# Patient Record
Sex: Male | Born: 1980 | Race: White | Hispanic: No | Marital: Married | Smoking: Current every day smoker
Health system: Southern US, Community
[De-identification: ages and names within clinical notes are randomized; demographics above are authoritative.]

## PROBLEM LIST (undated history)

## (undated) DIAGNOSIS — IMO0002 Reserved for concepts with insufficient information to code with codable children: Secondary | ICD-10-CM

## (undated) DIAGNOSIS — K029 Dental caries, unspecified: Secondary | ICD-10-CM

## (undated) DIAGNOSIS — N451 Epididymitis: Secondary | ICD-10-CM

## (undated) HISTORY — PX: VARICOCELE EXCISION: SUR582

## (undated) HISTORY — PX: KNEE ARTHROSCOPY: SUR90

---
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
## 2004-03-20 ENCOUNTER — Emergency Department (HOSPITAL_COMMUNITY): Admission: EM | Admit: 2004-03-20 | Discharge: 2004-03-20 | Payer: Self-pay | Admitting: Family Medicine

## 2004-06-04 ENCOUNTER — Emergency Department (HOSPITAL_COMMUNITY): Admission: EM | Admit: 2004-06-04 | Discharge: 2004-06-04 | Payer: Self-pay | Admitting: Family Medicine

## 2004-06-09 ENCOUNTER — Emergency Department (HOSPITAL_COMMUNITY): Admission: EM | Admit: 2004-06-09 | Discharge: 2004-06-09 | Payer: Self-pay | Admitting: Family Medicine

## 2006-01-22 ENCOUNTER — Emergency Department (HOSPITAL_COMMUNITY): Admission: EM | Admit: 2006-01-22 | Discharge: 2006-01-22 | Payer: Self-pay | Admitting: Family Medicine

## 2007-06-20 ENCOUNTER — Emergency Department: Payer: Self-pay

## 2007-07-17 ENCOUNTER — Emergency Department: Payer: Self-pay | Admitting: Emergency Medicine

## 2007-09-25 ENCOUNTER — Emergency Department: Payer: Self-pay | Admitting: Emergency Medicine

## 2007-09-25 ENCOUNTER — Other Ambulatory Visit: Payer: Self-pay

## 2008-08-26 ENCOUNTER — Emergency Department: Payer: Self-pay

## 2008-09-11 ENCOUNTER — Emergency Department: Payer: Self-pay | Admitting: Emergency Medicine

## 2008-10-06 ENCOUNTER — Emergency Department (HOSPITAL_COMMUNITY): Admission: EM | Admit: 2008-10-06 | Discharge: 2008-10-06 | Payer: Self-pay | Admitting: Family Medicine

## 2009-01-09 ENCOUNTER — Emergency Department (HOSPITAL_COMMUNITY): Admission: EM | Admit: 2009-01-09 | Discharge: 2009-01-09 | Payer: Self-pay | Admitting: Emergency Medicine

## 2009-06-05 ENCOUNTER — Emergency Department: Payer: Self-pay | Admitting: Emergency Medicine

## 2009-06-24 ENCOUNTER — Emergency Department: Payer: Self-pay | Admitting: Emergency Medicine

## 2009-07-04 ENCOUNTER — Emergency Department (HOSPITAL_COMMUNITY): Admission: EM | Admit: 2009-07-04 | Discharge: 2009-07-04 | Payer: Self-pay | Admitting: Family Medicine

## 2009-07-17 ENCOUNTER — Emergency Department: Payer: Self-pay | Admitting: Emergency Medicine

## 2009-07-23 ENCOUNTER — Emergency Department: Payer: Self-pay | Admitting: Emergency Medicine

## 2010-03-30 ENCOUNTER — Emergency Department: Payer: Self-pay | Admitting: Emergency Medicine

## 2011-02-20 LAB — HIV ANTIBODY (ROUTINE TESTING W REFLEX): HIV: NONREACTIVE

## 2011-03-02 LAB — GC/CHLAMYDIA PROBE AMP, GENITAL: Chlamydia, DNA Probe: NEGATIVE

## 2011-03-02 LAB — HIV ANTIBODY (ROUTINE TESTING W REFLEX): HIV: NONREACTIVE

## 2011-03-02 LAB — RPR: RPR Ser Ql: NONREACTIVE

## 2011-11-03 ENCOUNTER — Emergency Department: Payer: Self-pay | Admitting: Emergency Medicine

## 2011-12-17 ENCOUNTER — Emergency Department: Payer: Self-pay | Admitting: Unknown Physician Specialty

## 2011-12-17 LAB — CBC WITH DIFFERENTIAL/PLATELET
Basophil %: 0.3 %
Eosinophil %: 2.2 %
HCT: 45.6 % (ref 40.0–52.0)
HGB: 16 g/dL (ref 13.0–18.0)
Lymphocyte #: 1.6 10*3/uL (ref 1.0–3.6)
MCH: 33.3 pg (ref 26.0–34.0)
MCV: 95 fL (ref 80–100)
Monocyte #: 0.8 10*3/uL — ABNORMAL HIGH (ref 0.0–0.7)
Monocyte %: 5.6 %
Neutrophil #: 10.8 10*3/uL — ABNORMAL HIGH (ref 1.4–6.5)
RBC: 4.81 10*6/uL (ref 4.40–5.90)
WBC: 13.5 10*3/uL — ABNORMAL HIGH (ref 3.8–10.6)

## 2011-12-17 LAB — BASIC METABOLIC PANEL
BUN: 21 mg/dL — ABNORMAL HIGH (ref 7–18)
Chloride: 103 mmol/L (ref 98–107)
EGFR (Non-African Amer.): >60
Glucose: 94 mg/dL (ref 65–99)
Potassium: 4 mmol/L (ref 3.5–5.1)
Sodium: 142 mmol/L (ref 136–145)

## 2011-12-20 ENCOUNTER — Emergency Department: Payer: Self-pay | Admitting: Emergency Medicine

## 2011-12-29 ENCOUNTER — Emergency Department (HOSPITAL_COMMUNITY)
Admission: EM | Admit: 2011-12-29 | Discharge: 2011-12-29 | Disposition: A | Discharge status: Home / Self Care | Payer: Self-pay | Attending: Emergency Medicine | Admitting: Emergency Medicine

## 2011-12-29 ENCOUNTER — Emergency Department (HOSPITAL_COMMUNITY): Payer: Self-pay

## 2011-12-29 ENCOUNTER — Encounter (HOSPITAL_COMMUNITY): Payer: Self-pay | Admitting: *Deleted

## 2011-12-29 DIAGNOSIS — R1032 Left lower quadrant pain: Secondary | ICD-10-CM | POA: Insufficient documentation

## 2011-12-29 DIAGNOSIS — IMO0002 Reserved for concepts with insufficient information to code with codable children: Secondary | ICD-10-CM | POA: Insufficient documentation

## 2011-12-29 DIAGNOSIS — R10819 Abdominal tenderness, unspecified site: Secondary | ICD-10-CM | POA: Insufficient documentation

## 2011-12-29 DIAGNOSIS — R197 Diarrhea, unspecified: Secondary | ICD-10-CM | POA: Insufficient documentation

## 2011-12-29 DIAGNOSIS — R110 Nausea: Secondary | ICD-10-CM | POA: Insufficient documentation

## 2011-12-29 DIAGNOSIS — R141 Gas pain: Secondary | ICD-10-CM | POA: Insufficient documentation

## 2011-12-29 DIAGNOSIS — N451 Epididymitis: Secondary | ICD-10-CM | POA: Insufficient documentation

## 2011-12-29 DIAGNOSIS — F17200 Nicotine dependence, unspecified, uncomplicated: Secondary | ICD-10-CM | POA: Insufficient documentation

## 2011-12-29 DIAGNOSIS — R195 Other fecal abnormalities: Secondary | ICD-10-CM | POA: Insufficient documentation

## 2011-12-29 DIAGNOSIS — R142 Eructation: Secondary | ICD-10-CM | POA: Insufficient documentation

## 2011-12-29 HISTORY — DX: Epididymitis: N45.1

## 2011-12-29 HISTORY — DX: Reserved for concepts with insufficient information to code with codable children: IMO0002

## 2011-12-29 LAB — BASIC METABOLIC PANEL
CO2: 27 mEq/L (ref 19–32)
Calcium: 10.1 mg/dL (ref 8.4–10.5)
Creatinine, Ser: 1.25 mg/dL (ref 0.50–1.35)
Glucose, Bld: 108 mg/dL — ABNORMAL HIGH (ref 70–99)

## 2011-12-29 LAB — CBC
Hemoglobin: 16.7 g/dL (ref 13.0–17.0)
MCHC: 36.7 g/dL — ABNORMAL HIGH (ref 30.0–36.0)
Platelets: 225 10*3/uL (ref 150–400)

## 2011-12-29 LAB — OCCULT BLOOD, POC DEVICE: Fecal Occult Bld: POSITIVE

## 2011-12-29 LAB — DIFFERENTIAL
Eosinophils Relative: 0 % (ref 0–5)
Lymphs Abs: 1.3 10*3/uL (ref 0.7–4.0)
Monocytes Absolute: 0.6 10*3/uL (ref 0.1–1.0)
Neutro Abs: 14.5 10*3/uL — ABNORMAL HIGH (ref 1.7–7.7)
Neutrophils Relative %: 88 % — ABNORMAL HIGH (ref 43–77)

## 2011-12-29 MED ORDER — ONDANSETRON HCL 4 MG PO TABS: 4.0000 mg | ORAL_TABLET | Freq: Four times a day (QID) | ORAL | Status: AC

## 2011-12-29 MED ORDER — OXYCODONE-ACETAMINOPHEN 5-325 MG PO TABS: ORAL_TABLET | ORAL | Status: AC

## 2011-12-29 MED ORDER — CIPROFLOXACIN HCL 500 MG PO TABS: 500.0000 mg | ORAL_TABLET | Freq: Two times a day (BID) | ORAL | Status: DC

## 2011-12-29 MED ORDER — CIPROFLOXACIN HCL 500 MG PO TABS: 500.0000 mg | ORAL_TABLET | Freq: Two times a day (BID) | ORAL | Status: AC

## 2011-12-29 MED ORDER — HYDROMORPHONE HCL PF 1 MG/ML IJ SOLN
1.0000 mg | Freq: Once | INTRAMUSCULAR | Status: AC
  Administered 2011-12-29: 1 mg via INTRAVENOUS
  Filled 2011-12-29: qty 1

## 2011-12-29 MED ORDER — ONDANSETRON HCL 4 MG/2ML IJ SOLN: 4.0000 mg | Freq: Once | INTRAMUSCULAR | Status: DC

## 2011-12-29 MED ORDER — ONDANSETRON HCL 4 MG/2ML IJ SOLN
4.0000 mg | Freq: Once | INTRAMUSCULAR | Status: AC
  Administered 2011-12-29: 4 mg via INTRAVENOUS
  Filled 2011-12-29: qty 2

## 2011-12-29 MED ORDER — PANTOPRAZOLE SODIUM 40 MG IV SOLR
40.0000 mg | Freq: Once | INTRAVENOUS | Status: AC
  Administered 2011-12-29: 40 mg via INTRAVENOUS
  Filled 2011-12-29: qty 40

## 2011-12-29 MED ORDER — IOHEXOL 300 MG/ML  SOLN
80.0000 mL | Freq: Once | INTRAMUSCULAR | Status: AC | PRN
  Administered 2011-12-29: 80 mL via INTRAVENOUS

## 2011-12-29 MED ORDER — OXYCODONE-ACETAMINOPHEN 5-325 MG PO TABS
2.0000 | ORAL_TABLET | Freq: Once | ORAL | Status: AC
  Administered 2011-12-29: 2 via ORAL
  Filled 2011-12-29: qty 2

## 2011-12-29 MED ORDER — SODIUM CHLORIDE 0.9 % IV BOLUS (SEPSIS)
1000.0000 mL | Freq: Once | INTRAVENOUS | Status: AC
  Administered 2011-12-29: 1000 mL via INTRAVENOUS

## 2011-12-29 MED ORDER — HYDROMORPHONE HCL PF 1 MG/ML IJ SOLN: 1.0000 mg | Freq: Once | INTRAMUSCULAR | Status: DC

## 2011-12-29 NOTE — Discharge Instructions (Signed)
Take antibiotic for complete course. Use percocet as needed for pain and zofran as needed for nausea. You may consider over the counter imodium to help with diarrhea but DO NOT TAKE LOMOTIL. Stay well hydrated following a bland diet for the next 72 hours. Return to ER for changing or worsening of symptoms as discussed.   Bloody Diarrhea Bloody diarrhea can be caused by many different conditions. Most of the time bloody diarrhea is the result of food poisoning or minor infections. Bloody diarrhea usually improves over 2 to 3 days of rest and fluid replacement. Other conditions that can cause bloody diarrhea include:  Internal bleeding.   Infection.   Diseases of the bowel and colon.  Internal bleeding from an ulcer or bowel disease can be severe and requires hospital care or even surgery. DIAGNOSIS  To find out what is wrong your caregiver may check your:  Stool.   Blood.   Results from a test that looks inside the body (endoscopy).  TREATMENT   Get plenty of rest.   Drink enough water and fluids to keep your urine clear or pale yellow.   Do not smoke.   Solid foods and dairy products should be avoided until your illness improves.   As you improve, slowly return to a regular diet with easily-digested foods first. Examples are:   Bananas.   Rice.   Toast.   Crackers.  You should only need these for about 2 days before adding more normal foods to your diet.  Avoid spicy or fatty foods as well as caffeine and alcohol for several days.   Medicine to control cramping and diarrhea can relieve symptoms but may prolong some cases of bloody diarrhea. Antibiotics can speed recovery from diarrhea due to some bacterial infections. Call your caregiver if diarrhea does not get better in 3 days.  SEEK MEDICAL CARE IF:   You do not improve after 3 days.   Your diarrhea improves but your stool appears black.  SEEK IMMEDIATE MEDICAL CARE IF:   You become extremely weak or faint.    You become very sweaty.   You have increased pain or bleeding.   You develop repeated vomiting.   You vomit and you see blood or the vomit looks black in color.   You have a fever.  Document Released: 11/01/2005 Document Revised: 07/14/2011 Document Reviewed: 10/03/2009 [facility] Patient Information 2012 [place], [place].  Diet for Diarrhea, Adult Having frequent, runny stools (diarrhea) has many causes. Diarrhea may be caused or worsened by food or drink. Diarrhea may be relieved by changing your diet. IF YOU ARE NOT TOLERATING SOLID FOODS:  Drink enough water and fluids to keep your urine clear or pale yellow.   Avoid sugary drinks and sodas as well as milk-based beverages.   Avoid beverages containing caffeine and alcohol.   You may try rehydrating beverages. You can make your own by following this recipe:    tsp table salt.    tsp baking soda.   ? tsp salt substitute (potassium chloride).   1 tbs + 1 tsp sugar.   1 qt water.  As your stools become more solid, you can start eating solid foods. Add foods one at a time. If a certain food causes your diarrhea to get worse, avoid that food and try other foods. A low fiber, low-fat, and lactose-free diet is recommended. Small, frequent meals may be better tolerated.  Starches  Allowed:  White, [place], and pita breads, plain rolls, buns, bagels. Plain muffins,  matzo. Neita Carp, saltine, or graham crackers. Pretzels, melba toast, zwieback. Cooked cereals made with water: cornmeal, farina, cream cereals. Dry cereals: refined corn, wheat, rice. Potatoes prepared any way without skins, refined macaroni, spaghetti, noodles, refined rice.   Avoid:  Bread, rolls, or crackers made with whole wheat, multi-grains, rye, bran seeds, nuts, or coconut. Corn tortillas or taco shells. Cereals containing whole grains, multi-grains, bran, coconut, nuts, or raisins. Cooked or dry oatmeal. Coarse wheat cereals, granola. Cereals advertised as  "high-fiber." Potato skins. Whole grain pasta, wild or brown rice. Popcorn. Sweet potatoes/yams. Sweet rolls, doughnuts, waffles, pancakes, sweet breads.  Vegetables  Allowed: Strained tomato and vegetable juices. Most well-cooked and canned vegetables without seeds. Fresh: Tender lettuce, cucumber without the skin, cabbage, spinach, bean sprouts.   Avoid: Fresh, cooked, or canned: Artichokes, baked beans, beet greens, broccoli, Brussels sprouts, corn, kale, legumes, peas, sweet potatoes. Cooked: Green or red cabbage, spinach. Avoid large servings of any vegetables, because vegetables shrink when cooked, and they contain more fiber per serving than fresh vegetables.  Fruit  Allowed: All fruit juices except prune juice. Cooked or canned: Apricots, applesauce, cantaloupe, cherries, fruit cocktail, grapefruit, grapes, kiwi, mandarin oranges, peaches, pears, plums, watermelon. Fresh: Apples without skin, ripe banana, grapes, cantaloupe, cherries, grapefruit, peaches, oranges, plums. Keep servings limited to  cup or 1 piece.   Avoid: Fresh: Apple with skin, apricots, mango, pears, raspberries, strawberries. Prune juice, stewed or dried prunes. Dried fruits, raisins, dates. Large servings of all fresh fruits.  Meat and Meat Substitutes  Allowed: Ground or well-cooked tender beef, ham, veal, lamb, pork, or poultry. Eggs, plain cheese. Fish, oysters, shrimp, lobster, other seafoods. Liver, organ meats.   Avoid: Tough, fibrous meats with gristle. Peanut butter, smooth or chunky. Cheese, nuts, seeds, legumes, dried peas, beans, lentils.  Milk  Allowed: Yogurt, lactose-free milk, kefir, drinkable yogurt, buttermilk, soy milk.   Avoid: Milk, chocolate milk, beverages made with milk, such as milk shakes.  Soups  Allowed: Bouillon, broth, or soups made from allowed foods. Any strained soup.   Avoid: Soups made from vegetables that are not allowed, cream or milk-based soups.  Desserts and  Sweets  Allowed: Sugar-free gelatin, sugar-free frozen ice pops made without sugar alcohol.   Avoid: Plain cakes and cookies, pie made with allowed fruit, pudding, custard, cream pie. Gelatin, fruit, ice, sherbet, frozen ice pops. Ice cream, ice milk without nuts. Plain hard candy, honey, jelly, molasses, syrup, sugar, chocolate syrup, gumdrops, marshmallows.  Fats and Oils  Allowed: Avoid any fats and oils.   Avoid: Seeds, nuts, olives, avocados. Margarine, butter, cream, mayonnaise, salad oils, plain salad dressings made from allowed foods. Plain gravy, crisp bacon without rind.  Beverages  Allowed: Water, decaffeinated teas, oral rehydration solutions, sugar-free beverages.   Avoid: Fruit juices, caffeinated beverages (coffee, tea, soda or pop), alcohol, sports drinks, or lemon-lime soda or pop.  Condiments  Allowed: Ketchup, mustard, horseradish, vinegar, cream sauce, cheese sauce, cocoa powder. Spices in moderation: allspice, basil, bay leaves, celery powder or leaves, cinnamon, cumin powder, curry powder, ginger, mace, marjoram, onion or garlic powder, oregano, paprika, parsley flakes, ground pepper, rosemary, sage, savory, tarragon, thyme, turmeric.   Avoid: Coconut, honey.  Weight Monitoring: Weigh yourself every day. You should weigh yourself in the morning after you urinate and before you eat breakfast. Wear the same amount of clothing when you weigh yourself. Record your weight daily. Bring your recorded weights to your clinic visits. Tell your caregiver right away if you have gained 3 lb/1.4  kg or more in 1 day, 5 lb/2.3 kg in a week, or whatever amount you were told to report. SEEK IMMEDIATE MEDICAL CARE IF:   You are unable to keep fluids down.   You start to throw up (vomit) or diarrhea keeps coming back (persistent).   Abdominal pain develops, increases, or can be felt in one place (localizes).   You have an oral temperature above 102 F (38.9 C), not controlled by  medicine.   Diarrhea contains blood or mucus.   You develop excessive weakness, dizziness, fainting, or extreme thirst.  MAKE SURE YOU:   Understand these instructions.   Will watch your condition.   Will get help right away if you are not doing well or get worse.  Document Released: 01/22/2004 Document Revised: 07/14/2011 Document Reviewed: 05/15/2009 [facility] Patient Information 2012 [place], [place].

## 2011-12-29 NOTE — ED Provider Notes (Signed)
History     CSN: [id number]  Arrival date & time 12/29/11  1125   First MD Initiated Contact with Patient 12/29/11 1130      Chief Complaint  Patient presents with  . Rectal Bleeding    (Consider location/radiation/quality/duration/timing/severity/associated sxs/prior treatment) HPI  Patient presents to ER complaining of acute onset lower abdominal cramping and bloody diarrhea that woke him from his sleep at 1am and has been persistent and unchanging since onset. Associated nausea but denies vomiting. Denies fevers or chills. Patient states he recently completed a week course of Keflex for URI. Patient notes that approximately 2 years ago when living in [place] he was evaluated for black stool but does not remember specific details or outcome to that evaluation. Patient states he was dx with stomach ulcers as a child but denies known hx of bleeding ulcers or GI bleed. Denies sick contacts. Denies aggravating or alleviating factors. Patient takes no meds on regular basis.   Past Medical History  Diagnosis Date  . Ulcer   . Epididymitis     Past Surgical History  Procedure Date  . Knee arthroscopy     No family history on file.  History  Substance Use Topics  . Smoking status: Current Everyday Smoker -- 0.5 packs/day for 10 years    Types: Cigarettes  . Smokeless tobacco: Not on file  . Alcohol Use: No      Review of Systems  All other systems reviewed and are negative.    Allergies  Morphine and related and Vancomycin  Home Medications   Current Outpatient Rx  Name Route Sig Dispense Refill  . CEPHALEXIN 500 MG PO CAPS Oral Take 500 mg by mouth 4 (four) times daily. Take for 7 days. First doses 12/18/11      BP 118/74  Pulse 67  Temp(Src) 97.6 F (36.4 C) (Oral)  Resp 16  Ht 5\' 6"  (1.676 m)  Wt 180 lb (81.647 kg)  BMI 29.05 kg/m2  SpO2 94%  Physical Exam  Nursing note and vitals reviewed. Constitutional: He is oriented to person, place, and time. He  appears well-developed and well-nourished. No distress.  HENT:  Head: Normocephalic and atraumatic.  Eyes: Conjunctivae are normal.  Neck: Normal range of motion. Neck supple.  Cardiovascular: Normal rate, regular rhythm, normal heart sounds and intact distal pulses.  Exam reveals no gallop and no friction rub.   No murmur heard. Pulmonary/Chest: Effort normal and breath sounds normal. No respiratory distress. He has no wheezes. He has no rales. He exhibits no tenderness.  Abdominal: Soft. Bowel sounds are normal. He exhibits no distension and no mass. There is tenderness. There is no rebound and no guarding.       Diffuse TTP of lower abdomen with guarding but no rigidity.   Genitourinary: Guaiac positive stool.  Musculoskeletal: Normal range of motion. He exhibits no edema and no tenderness.  Neurological: He is alert and oriented to person, place, and time.  Skin: Skin is warm and dry. No rash noted. He is not diaphoretic. No erythema.  Psychiatric: He has a normal mood and affect.    ED Course  Procedures (including critical care time)  IV fluids, IV dilaudid and zofran  Patient instructed to collect stool. Blood diarrhea visualized in toilet from previous BM with bright red blood.  Patient states the lower abdominal cramping is improving.   Labs Reviewed  CBC - Abnormal; Notable for the following:    WBC 16.4 (*)    MCHC 36.7 (*)  All other components within normal limits  DIFFERENTIAL - Abnormal; Notable for the following:    Neutrophils Relative 88 (*)    Neutro Abs 14.5 (*)    Lymphocytes Relative 8 (*)    All other components within normal limits  BASIC METABOLIC PANEL - Abnormal; Notable for the following:    Glucose, Bld 108 (*)    GFR calc non Af Amer 77 (*)    GFR calc Af Amer 89 (*)    All other components within normal limits  CLOSTRIDIUM DIFFICILE BY PCR  OCCULT BLOOD, POC DEVICE  OCCULT BLOOD X 1 CARD TO LAB, STOOL  STOOL CULTURE   Ct Abdomen Pelvis W  Contrast  12/29/2011  *RADIOLOGY REPORT*  Clinical Data: The left lower quadrant pain, abdominal distention. Bloody stools.  CT ABDOMEN AND PELVIS WITH CONTRAST  Technique:  Multidetector CT imaging of the abdomen and pelvis was performed following the standard protocol during bolus administration of intravenous contrast.  Contrast: 80mL OMNIPAQUE IOHEXOL 300 MG/ML IV SOLN  Comparison: 04/14/2009  Findings: Lung bases are clear.  No effusions.  Heart is normal size.  Liver, gallbladder, spleen, pancreas, adrenals and kidneys are normal.  Colon is decompressed and grossly unremarkable.  Small bowel unremarkable.  No free fluid, free air or adenopathy.  Aorta is normal caliber.  Urinary bladder is unremarkable.  No acute bony abnormality.  IMPRESSION: No acute findings in the abdomen or pelvis.  Original Report Authenticated By: Cyndie Chime, M.D.     1. Bloody diarrhea       MDM  Bloody diarrhea without acute findings on CT scan and C diff negative. Question infectious diarrhea of unknown source. Stable hgb. Spoke at length with patient about OP treatment of infectious diarrhea, with abx, pain medication and nausea medication addressing at length worsening signs and symptoms that should warrant return to ER. Patient voices his understanding and is agreeable to plan.   Afebrile, non toxic appearing.         Jenness Corner, [place] 12/29/11 1523

## 2011-12-29 NOTE — ED Notes (Signed)
Drinking contrast for CT scan

## 2011-12-29 NOTE — ED Notes (Signed)
Ct notified pt finished drinking contrast.  

## 2011-12-29 NOTE — ED Notes (Signed)
Reports LLQ pain, n/v, bloody stools started 0100. Pain worse with BM's, very tender. C/o abd bloated. No fever.

## 2011-12-29 NOTE — ED Notes (Signed)
C/o bloody stools, abd cramping since 0100. Denies n/v

## 2011-12-29 NOTE — ED Notes (Signed)
Awaiting test results. No bloody BM's or active vomiting presently

## 2011-12-29 NOTE — ED Provider Notes (Signed)
Medical screening examination/treatment/procedure(s) were performed by non-physician practitioner and as supervising physician I was immediately available for consultation/collaboration.   Dequandre Cordova, MD 12/29/11 1550 

## 2011-12-29 NOTE — ED Notes (Signed)
Patient transported to CT 

## 2012-01-02 LAB — STOOL CULTURE

## 2013-01-15 ENCOUNTER — Emergency Department (HOSPITAL_COMMUNITY)
Admission: EM | Admit: 2013-01-15 | Discharge: 2013-01-15 | Disposition: A | Discharge status: Home / Self Care | Payer: Self-pay | Attending: Emergency Medicine | Admitting: Emergency Medicine

## 2013-01-15 ENCOUNTER — Encounter (HOSPITAL_COMMUNITY): Payer: Self-pay | Admitting: Emergency Medicine

## 2013-01-15 DIAGNOSIS — K029 Dental caries, unspecified: Secondary | ICD-10-CM | POA: Insufficient documentation

## 2013-01-15 DIAGNOSIS — Z8719 Personal history of other diseases of the digestive system: Secondary | ICD-10-CM | POA: Insufficient documentation

## 2013-01-15 DIAGNOSIS — K056 Periodontal disease, unspecified: Secondary | ICD-10-CM | POA: Insufficient documentation

## 2013-01-15 DIAGNOSIS — Z87448 Personal history of other diseases of urinary system: Secondary | ICD-10-CM | POA: Insufficient documentation

## 2013-01-15 DIAGNOSIS — F17200 Nicotine dependence, unspecified, uncomplicated: Secondary | ICD-10-CM | POA: Insufficient documentation

## 2013-01-15 MED ORDER — OXYCODONE-ACETAMINOPHEN 5-325 MG PO TABS: 1.0000 | ORAL_TABLET | ORAL | Status: DC | PRN

## 2013-01-15 MED ORDER — OXYCODONE-ACETAMINOPHEN 5-325 MG PO TABS
1.0000 | ORAL_TABLET | Freq: Once | ORAL | Status: AC
  Administered 2013-01-15: 1 via ORAL
  Filled 2013-01-15: qty 1

## 2013-01-15 MED ORDER — PENICILLIN V POTASSIUM 500 MG PO TABS
500.0000 mg | ORAL_TABLET | Freq: Once | ORAL | Status: AC
  Administered 2013-01-15: 500 mg via ORAL
  Filled 2013-01-15: qty 1

## 2013-01-15 MED ORDER — PENICILLIN V POTASSIUM 500 MG PO TABS: 500.0000 mg | ORAL_TABLET | Freq: Four times a day (QID) | ORAL | Status: DC

## 2013-01-15 NOTE — ED Provider Notes (Signed)
History    This chart was scribed for non-physician practitioner working with [name], MD by Charolett Bumpers, ED Scribe. This patient was seen in room WTR6/WTR6 and the patient's care was started at 1716.   CSN: [id number]  Arrival date & time 01/15/13  1606   First MD Initiated Contact with Patient 01/15/13 1716      Chief Complaint  Patient presents with  . Dental Pain    The history is provided by the patient. No language interpreter was used.   Jonathan Duke is a 32 y.o. male who presents to the Emergency Department complaining of persistent, moderate right upper wisdom tooth pain that has gradually worsened over the past week. He reports associated right sided facial swelling. He has been taking Ibuprofen and Tramadol without relief. He is a smoker.   Past Medical History  Diagnosis Date  . Ulcer   . Epididymitis     Past Surgical History  Procedure Laterality Date  . Knee arthroscopy      No family history on file.  History  Substance Use Topics  . Smoking status: Current Every Day Smoker -- 0.50 packs/day for 10 years    Types: Cigarettes  . Smokeless tobacco: Not on file  . Alcohol Use: No      Review of Systems A complete 10 system review of systems was obtained and all systems are negative except as noted in the HPI and PMH.   Allergies  Morphine and related and Vancomycin  Home Medications   Current Outpatient Rx  Name  Route  Sig  Dispense  Refill  . traMADol (ULTRAM) 50 MG tablet   Oral   Take 50 mg by mouth once.           BP 139/64  Pulse 96  Temp(Src) 98.7 F (37.1 C) (Oral)  Resp 17  SpO2 100%  Physical Exam  Nursing note and vitals reviewed. Constitutional: He is oriented to person, place, and time. He appears well-developed and well-nourished. No distress.  HENT:  Head: Normocephalic and atraumatic.  Mouth/Throat: Oropharynx is clear and moist.  On tooth #1, significant decay and moderate gingival swelling.  No signs of a fluctuant abscess.   Eyes: EOM are normal.  Neck: Neck supple. No tracheal deviation present.  Cardiovascular: Normal rate.   Pulmonary/Chest: Effort normal. No respiratory distress.  Musculoskeletal: Normal range of motion.  Neurological: He is alert and oriented to person, place, and time.  Skin: Skin is warm and dry.  Psychiatric: He has a normal mood and affect. His behavior is normal.    ED Course  Procedures (including critical care time)  DIAGNOSTIC STUDIES: Oxygen Saturation is 100% on room air, normal by my interpretation.    COORDINATION OF CARE:  17:35-Discussed planned course of treatment with the patient including a Percocet and Veetid here in ED, who is agreeable at this time. Advised to f/u with a dentist.   Labs Reviewed - No data to display No results found.   No diagnosis found.  1. Dental caries   MDM  Uncomplicated dental decay causing pain.   I personally performed the services described in this documentation, which was scribed in my presence. The recorded information has been reviewed and is accurate.        Arnoldo Hooker, PA-C 01/15/13 1747

## 2013-01-15 NOTE — Progress Notes (Signed)
[facility] ED CM noted not pcp for pt CM spoke with pt about list of [place] self pay pcps and referrals to dentists CM informed pt that dental referral can be provided but [facility] does not manage the cost of care by these dental providers and [place] does not have listed self pay dental providers.  Provided him with the list of available self pay pcps for [place], health reform/ affordable care act resources and medication resources for self pay patients Pt reports he has called a list of [place] [facility] therapist and was informed of cost of care/fee ($400) Reports having half of co pay and he will attempt to obtain the rest of the co pay

## 2013-01-15 NOTE — ED Provider Notes (Signed)
Medical screening examination/treatment/procedure(s) were performed by non-physician practitioner and as supervising physician I was immediately available for consultation/collaboration.   [name], MD 01/15/13 [phone]

## 2013-01-15 NOTE — ED Notes (Signed)
States that he has right upper wisdom tooth that is very painful. States that he has tried ibuprogen and tramadol and nothing is helping.

## 2013-02-10 ENCOUNTER — Encounter (HOSPITAL_COMMUNITY): Payer: Self-pay | Admitting: *Deleted

## 2013-02-10 ENCOUNTER — Emergency Department (HOSPITAL_COMMUNITY)
Admission: EM | Admit: 2013-02-10 | Discharge: 2013-02-11 | Disposition: A | Discharge status: Home / Self Care | Payer: Self-pay | Attending: Emergency Medicine | Admitting: Emergency Medicine

## 2013-02-10 DIAGNOSIS — K006 Disturbances in tooth eruption: Secondary | ICD-10-CM | POA: Insufficient documentation

## 2013-02-10 DIAGNOSIS — Z87898 Personal history of other specified conditions: Secondary | ICD-10-CM | POA: Insufficient documentation

## 2013-02-10 DIAGNOSIS — Z79899 Other long term (current) drug therapy: Secondary | ICD-10-CM | POA: Insufficient documentation

## 2013-02-10 DIAGNOSIS — K011 Impacted teeth: Secondary | ICD-10-CM

## 2013-02-10 DIAGNOSIS — K029 Dental caries, unspecified: Secondary | ICD-10-CM | POA: Insufficient documentation

## 2013-02-10 DIAGNOSIS — Z8711 Personal history of peptic ulcer disease: Secondary | ICD-10-CM | POA: Insufficient documentation

## 2013-02-10 DIAGNOSIS — F17200 Nicotine dependence, unspecified, uncomplicated: Secondary | ICD-10-CM | POA: Insufficient documentation

## 2013-02-10 MED ORDER — KETOROLAC TROMETHAMINE 60 MG/2ML IM SOLN
60.0000 mg | Freq: Once | INTRAMUSCULAR | Status: AC
  Administered 2013-02-11: 60 mg via INTRAMUSCULAR
  Filled 2013-02-10: qty 2

## 2013-02-10 MED ORDER — OXYCODONE-ACETAMINOPHEN 5-325 MG PO TABS: 1.0000 | ORAL_TABLET | ORAL | Status: DC | PRN

## 2013-02-10 MED ORDER — OXYCODONE-ACETAMINOPHEN 5-325 MG PO TABS
1.0000 | ORAL_TABLET | Freq: Once | ORAL | Status: AC
  Administered 2013-02-11: 1 via ORAL
  Filled 2013-02-10: qty 1

## 2013-02-10 MED ORDER — PENICILLIN V POTASSIUM 500 MG PO TABS: 500.0000 mg | ORAL_TABLET | Freq: Four times a day (QID) | ORAL | Status: AC

## 2013-02-10 MED ORDER — ONDANSETRON 4 MG PO TBDP
4.0000 mg | ORAL_TABLET | Freq: Once | ORAL | Status: AC
  Administered 2013-02-11: 4 mg via ORAL
  Filled 2013-02-10: qty 1

## 2013-02-10 NOTE — ED Notes (Signed)
Pt in c/o toothache to right upper jaw, some swelling noted, fever yesterday but has improved today

## 2013-02-10 NOTE — ED Provider Notes (Signed)
History    This chart was scribed for non-physician practitioner, Marlon Pel PA-C working with Jones Skene, MD by Smitty Pluck, ED scribe. This patient was seen in room WTR8/WTR8 and the patient's care was started at 11:29PM.   CSN: [id number]  Arrival date & time 02/10/13  2152        Chief Complaint  Patient presents with  . Dental Pain    The history is provided by the patient. No language interpreter was used.   Jonathan Duke is a 32 y.o. male who presents to the Emergency Department complaining of constant, moderate right upper dental pain onset 1 month ago and worsening since onset.  He was seen in ED for same symptoms 01/15/2013 and given abx. He states pain improved for a while but symptoms worsened. He states that he has taken percocet with relief, tramadol and ibuprofen (taken today around 12PM) without relief. Pt reports pain radiates to right jaw. He mentions having fever and chills 1 day ago but symptoms have subsided (current temperature in ED is 97.7). Pt denies nausea, vomiting, diarrhea, weakness, cough, SOB and any other pain.   Past Medical History  Diagnosis Date  . Ulcer   . Epididymitis     Past Surgical History  Procedure Laterality Date  . Knee arthroscopy      History reviewed. No pertinent family history.  History  Substance Use Topics  . Smoking status: Current Every Day Smoker -- 0.50 packs/day for 10 years    Types: Cigarettes  . Smokeless tobacco: Not on file  . Alcohol Use: No      Review of Systems  Constitutional: Positive for fever and chills.  HENT: Positive for dental problem.   Respiratory: Negative for shortness of breath.   Gastrointestinal: Negative for nausea and vomiting.  Neurological: Negative for weakness.    Allergies  Morphine and related; Vancomycin; and Vicodin  Home Medications   Current Outpatient Rx  Name  Route  Sig  Dispense  Refill  . acetaminophen (TYLENOL) 325 MG tablet   Oral   Take 325 mg  by mouth every 6 (six) hours as needed for pain. For fever         . ibuprofen (ADVIL,MOTRIN) 200 MG tablet   Oral   Take 600 mg by mouth every 6 (six) hours as needed for pain. For pain         . traMADol (ULTRAM) 50 MG tablet   Oral   Take 50 mg by mouth every 6 (six) hours as needed. For pain         . oxyCODONE-acetaminophen (PERCOCET/ROXICET) 5-325 MG per tablet   Oral   Take 1-2 tablets by mouth every 4 (four) hours as needed for pain.   15 tablet   0   . oxyCODONE-acetaminophen (PERCOCET/ROXICET) 5-325 MG per tablet   Oral   Take 1 tablet by mouth every 4 (four) hours as needed for pain.   15 tablet   0   . penicillin v potassium (VEETID) 500 MG tablet   Oral   Take 1 tablet (500 mg total) by mouth 4 (four) times daily.   40 tablet   0     BP 152/68  Pulse 75  Temp(Src) 97.7 F (36.5 C) (Oral)  Resp 20  Wt 180 lb (81.647 kg)  BMI 29.07 kg/m2  SpO2 99%  Physical Exam  Nursing note and vitals reviewed. Constitutional: He is oriented to person, place, and time. He appears well-developed and well-nourished. No  distress.  HENT:  Head: Normocephalic and atraumatic. No trismus in the jaw.  Mouth/Throat: Dental caries present. No dental abscesses.    3rd molar on upper right side is impacting against 2nd right side molar.  Eyes: Conjunctivae and EOM are normal. Pupils are equal, round, and reactive to light.  Neck: Normal range of motion. Neck supple. No tracheal deviation present.  Cardiovascular: Normal rate and regular rhythm.   Pulmonary/Chest: Effort normal and breath sounds normal. No respiratory distress.  Musculoskeletal: Normal range of motion.  Neurological: He is alert and oriented to person, place, and time.  Skin: Skin is warm and dry.  Psychiatric: He has a normal mood and affect. His behavior is normal.    ED Course  Procedures (including critical care time) DIAGNOSTIC STUDIES: Oxygen Saturation is 99% on room air, normal by my  interpretation.    COORDINATION OF CARE: 11:31 PM Discussed ED treatment with pt and pt agrees.     Labs Reviewed - No data to display No results found.   1. Impacted third molar tooth       MDM  Pt has been advised of the symptoms that warrant their return to the ED. Patient has voiced understanding and has agreed to follow-up with the PCP or specialist.  I personally performed the services described in this documentation, which was scribed in my presence. The recorded information has been reviewed and is accurate.      Dorthula Matas, PA-C 02/10/13 2339

## 2013-02-11 NOTE — ED Provider Notes (Signed)
Medical screening examination/treatment/procedure(s) were performed by non-physician practitioner and as supervising physician I was immediately available for consultation/collaboration.  Jones Skene, M.D.     Jones Skene, MD 02/11/13 1145

## 2013-02-20 ENCOUNTER — Emergency Department (HOSPITAL_COMMUNITY)
Admission: EM | Admit: 2013-02-20 | Discharge: 2013-02-20 | Disposition: A | Discharge status: Home / Self Care | Payer: Self-pay | Attending: Emergency Medicine | Admitting: Emergency Medicine

## 2013-02-20 ENCOUNTER — Encounter (HOSPITAL_COMMUNITY): Payer: Self-pay | Admitting: *Deleted

## 2013-02-20 DIAGNOSIS — K011 Impacted teeth: Secondary | ICD-10-CM

## 2013-02-20 DIAGNOSIS — K006 Disturbances in tooth eruption: Secondary | ICD-10-CM | POA: Insufficient documentation

## 2013-02-20 DIAGNOSIS — Z87448 Personal history of other diseases of urinary system: Secondary | ICD-10-CM | POA: Insufficient documentation

## 2013-02-20 DIAGNOSIS — F17200 Nicotine dependence, unspecified, uncomplicated: Secondary | ICD-10-CM | POA: Insufficient documentation

## 2013-02-20 DIAGNOSIS — Z872 Personal history of diseases of the skin and subcutaneous tissue: Secondary | ICD-10-CM | POA: Insufficient documentation

## 2013-02-20 MED ORDER — PENICILLIN V POTASSIUM 500 MG PO TABS: 500.0000 mg | ORAL_TABLET | Freq: Four times a day (QID) | ORAL | Status: DC

## 2013-02-20 MED ORDER — PERCOCET 5-325 MG PO TABS: 1.0000 | ORAL_TABLET | Freq: Four times a day (QID) | ORAL | Status: DC | PRN

## 2013-02-20 MED ORDER — HYDROMORPHONE HCL PF 2 MG/ML IJ SOLN
2.0000 mg | Freq: Once | INTRAMUSCULAR | Status: AC
  Administered 2013-02-20: 2 mg via INTRAMUSCULAR
  Filled 2013-02-20: qty 1

## 2013-02-20 NOTE — ED Provider Notes (Signed)
History    This chart was scribed for non-physician practitioner Jaci Carrel, PA-C working with No att. providers found by Gerlean Ren, ED Scribe. This patient was seen in room TR10C/TR10C and the patient's care was started at 8:46 PM.   CSN: [id number]  Arrival date & time 02/20/13  1955   None     Chief Complaint  Patient presents with  . Dental Pain     The history is provided by the patient. No language interpreter was used.  Jonathan Duke is a 32 y.o. male who presents to the Emergency Department complaining of 2 months of constant upper right dental pain for which he was seen at [facility] one month ago and prescribed penicillin (regiment finished 4 days ago) and referred to dentist.  Pt reports he was unable to make an appointment with the referred dentist because he/she was asking for too much money up front.  Pt reports occasional fevers and night sweats prior to being seen one month ago that have not been present since.  Pt has hydrocodone allergy.  Associated symptom is intermittent fevers adn facial swelling   Past Medical History  Diagnosis Date  . Ulcer   . Epididymitis     Past Surgical History  Procedure Laterality Date  . Knee arthroscopy      No family history on file.  History  Substance Use Topics  . Smoking status: Current Every Day Smoker -- 0.50 packs/day for 10 years    Types: Cigarettes  . Smokeless tobacco: Not on file  . Alcohol Use: No      Review of Systems See HPI. Allergies  Morphine and related; Vancomycin; and Vicodin  Home Medications   Current Outpatient Rx  Name  Route  Sig  Dispense  Refill  . traMADol (ULTRAM) 50 MG tablet   Oral   Take 50 mg by mouth every 6 (six) hours as needed. For pain           BP 138/86  Pulse 87  Temp(Src) 98.5 F (36.9 C) (Oral)  Resp 21  SpO2 98%  Physical Exam  Nursing note and vitals reviewed. Constitutional: He is oriented to person, place, and time. He appears well-developed and  well-nourished. No distress.  HENT:  Head: Normocephalic and atraumatic. No trismus in the jaw.  Mouth/Throat: Uvula is midline, oropharynx is clear and moist and mucous membranes are normal. Abnormal dentition. No dental abscesses or edematous. No oropharyngeal exudate, posterior oropharyngeal edema, posterior oropharyngeal erythema or tonsillar abscesses.    Good dental hygiene. Pt able to open and close mouth with out difficulty. Airway intact. Uvula midline. Mild gingival swelling with tenderness over affected area, but no fluctuance. No swelling or tenderness of submental and submandibular regions.  Eyes: Conjunctivae and EOM are normal.  Neck: Normal range of motion and full passive range of motion without pain. Neck supple.  Cardiovascular: Normal rate and regular rhythm.   Pulmonary/Chest: Effort normal and breath sounds normal. No stridor. No respiratory distress. He has no wheezes.  Musculoskeletal: Normal range of motion.  Lymphadenopathy:       Head (right side): No submental, no submandibular, no tonsillar, no preauricular and no posterior auricular adenopathy present.       Head (left side): No submental, no submandibular, no tonsillar, no preauricular and no posterior auricular adenopathy present.    He has no cervical adenopathy.  Neurological: He is alert and oriented to person, place, and time.  Skin: Skin is warm and dry. No  rash noted. He is not diaphoretic.    ED Course  Procedures (including critical care time) DIAGNOSTIC STUDIES: Oxygen Saturation is 98% on room air, normal by my interpretation.    COORDINATION OF CARE: 8:50 PM- Informed pt that tooth extractions are not performed in ED and that I can only refer pt to on-call dentist by rule.  Informed pt that I can provide him with pain medication at this time and referral to on-call dentist but that any dentist will require money up front.  Pt understands.     No diagnosis found.    MDM  Wisdom tooth  impaction Patient with toothache.  No gross abscess.  Exam unconcerning for Ludwig's angina or spread of infection. ecplained that we do not do dental extractions in the ER. Bc of intermittent swelling and fever will treat with penicillin and pain medicine.  Urged patient to follow-up with dentist.        I personally performed the services described in this documentation, which was scribed in my presence. The recorded information has been reviewed and is accurate.      Jaci Carrel, [place] 02/20/13 2110

## 2013-02-20 NOTE — ED Notes (Signed)
The pt has had a tootahce for 1-2 months .  He was seen at [facility] long ed one month ago.  He went to a dentist that wanted 500.00 to extract.  No money

## 2013-02-20 NOTE — ED Notes (Signed)
Pt comfortable with d/c and f/u instructions. Pt given prescriptions x2.

## 2013-02-20 NOTE — ED Notes (Signed)
Ice pack applied to face

## 2013-02-20 NOTE — ED Notes (Signed)
Pt states is in 10/10 pain, when offered to have PA come back in to reassess pt's pain before discharge, pt declines, states would like to go home and fill Percocet prescription. Pt comfortable with d/c instructions.

## 2013-02-25 ENCOUNTER — Emergency Department (HOSPITAL_COMMUNITY)
Admission: EM | Admit: 2013-02-25 | Discharge: 2013-02-25 | Disposition: A | Discharge status: Home / Self Care | Payer: Self-pay | Attending: Emergency Medicine | Admitting: Emergency Medicine

## 2013-02-25 ENCOUNTER — Encounter (HOSPITAL_COMMUNITY): Payer: Self-pay | Admitting: Emergency Medicine

## 2013-02-25 DIAGNOSIS — K089 Disorder of teeth and supporting structures, unspecified: Secondary | ICD-10-CM | POA: Insufficient documentation

## 2013-02-25 DIAGNOSIS — F17200 Nicotine dependence, unspecified, uncomplicated: Secondary | ICD-10-CM | POA: Insufficient documentation

## 2013-02-25 DIAGNOSIS — Z87448 Personal history of other diseases of urinary system: Secondary | ICD-10-CM | POA: Insufficient documentation

## 2013-02-25 DIAGNOSIS — Z872 Personal history of diseases of the skin and subcutaneous tissue: Secondary | ICD-10-CM | POA: Insufficient documentation

## 2013-02-25 DIAGNOSIS — K0889 Other specified disorders of teeth and supporting structures: Secondary | ICD-10-CM

## 2013-02-25 MED ORDER — HYDROMORPHONE HCL PF 1 MG/ML IJ SOLN
0.5000 mg | Freq: Once | INTRAMUSCULAR | Status: AC
  Administered 2013-02-25: 0.5 mg via INTRAMUSCULAR
  Filled 2013-02-25: qty 1

## 2013-02-25 MED ORDER — ONDANSETRON 4 MG PO TBDP
4.0000 mg | ORAL_TABLET | Freq: Once | ORAL | Status: AC
  Administered 2013-02-25: 4 mg via ORAL
  Filled 2013-02-25: qty 1

## 2013-02-25 MED ORDER — PENICILLIN V POTASSIUM 500 MG PO TABS: 500.0000 mg | ORAL_TABLET | Freq: Four times a day (QID) | ORAL | Status: DC

## 2013-02-25 MED ORDER — OXYCODONE-ACETAMINOPHEN 5-325 MG PO TABS: ORAL_TABLET | ORAL | Status: DC

## 2013-02-25 NOTE — ED Notes (Signed)
Pt c/o tooth pain x 1 month, pt states he does have consultation Friday, pt states he has not been able to pay for surgery, now is out of antx and pain meds.

## 2013-02-25 NOTE — ED Provider Notes (Signed)
History    This chart was scribed for non-physician practitioner Wynetta Emery PA-C working with Derwood Kaplan, MD by Smitty Pluck, ED scribe. This patient was seen in room WTR5/WTR5 and the patient's care was started at 9:49 PM.   CSN: [id number]  Arrival date & time 02/25/13  2003   Chief Complaint  Patient presents with  . Dental Pain     The history is provided by the patient. No language interpreter was used.   Jonathan Duke is a 32 y.o. male who presents to the Emergency Department complaining of intermitent, upper right moderate dental pain onset 1 month ago. He states he has dental appointment in 5 days. He states that he has taken abx and pain medication. Pt denies fever, chills, nausea, vomiting, diarrhea, weakness, cough, SOB and any other pain.   Past Medical History  Diagnosis Date  . Ulcer   . Epididymitis     Past Surgical History  Procedure Laterality Date  . Knee arthroscopy      No family history on file.  History  Substance Use Topics  . Smoking status: Current Every Day Smoker -- 0.50 packs/day for 10 years    Types: Cigarettes  . Smokeless tobacco: Not on file  . Alcohol Use: No      Review of Systems  Constitutional: Negative for fever.  HENT: Positive for dental problem.   Respiratory: Negative for shortness of breath.   Cardiovascular: Negative for chest pain.  Gastrointestinal: Negative for nausea, vomiting, abdominal pain and diarrhea.  All other systems reviewed and are negative.    Allergies  Morphine and related; Vancomycin; and Vicodin  Home Medications   Current Outpatient Rx  Name  Route  Sig  Dispense  Refill  . ibuprofen (ADVIL,MOTRIN) 200 MG tablet   Oral   Take 200 mg by mouth every 6 (six) hours as needed for pain.         Marland Kitchen PERCOCET 5-325 MG per tablet   Oral   Take 1 tablet by mouth every 6 (six) hours as needed for pain.   15 tablet   0     Dispense as written.   . traMADol (ULTRAM) 50 MG tablet   Oral   Take 50 mg by mouth every 6 (six) hours as needed. For pain         . penicillin v potassium (VEETID) 500 MG tablet   Oral   Take 1 tablet (500 mg total) by mouth 4 (four) times daily.   30 tablet   0     BP 153/99  Pulse 90  Temp(Src) 98.5 F (36.9 C) (Oral)  Resp 21  Ht 5\' 6"  (1.676 m)  Wt 180 lb (81.647 kg)  BMI 29.07 kg/m2  SpO2 99%  Physical Exam  Nursing note and vitals reviewed. Constitutional: He is oriented to person, place, and time. He appears well-developed and well-nourished. No distress.  HENT:  Head: Normocephalic.  Impacted wisdom tooth on right upper side. No gingival swelling with tenderness to palpation. There is no trismus.   Eyes: Conjunctivae and EOM are normal.  Cardiovascular: Normal rate.   Pulmonary/Chest: Effort normal. No stridor.  Musculoskeletal: Normal range of motion.  Neurological: He is alert and oriented to person, place, and time.  Psychiatric: He has a normal mood and affect.    ED Course  Procedures (including critical care time) DIAGNOSTIC STUDIES: Oxygen Saturation is 99% on room air, normal by my interpretation.    COORDINATION OF CARE: 9:50 PM  Discussed ED treatment with pt and pt agrees.  9:50 PM Ordered:  Medications  HYDROmorphone (DILAUDID) injection 0.5 mg (not administered)  ondansetron (ZOFRAN-ODT) disintegrating tablet 4 mg (not administered)       Labs Reviewed - No data to display No results found.   1. Pain, dental       MDM   Jonathan Duke is a 32 y.o. male with impacted wisdom tooth, antibiotics given prophylactically   Filed Vitals:   02/25/13 2051  BP: 153/99  Pulse: 90  Temp: 98.5 F (36.9 C)  TempSrc: Oral  Resp: 21  Height: 5\' 6"  (1.676 m)  Weight: 180 lb (81.647 kg)  SpO2: 99%     Pt verbalized understanding and agrees with care plan. Outpatient follow-up and return precautions given.    Discharge Medication List as of 02/25/2013  9:54 PM    START taking these  medications   Details  !! oxyCODONE-acetaminophen (PERCOCET/ROXICET) 5-325 MG per tablet 1 to 2 tabs PO q6hrs  PRN for pain, Print    !! penicillin v potassium (VEETID) 500 MG tablet Take 1 tablet (500 mg total) by mouth 4 (four) times daily., Starting 02/25/2013, Until Discontinued, Print     !! - Potential duplicate medications found. Please discuss with provider.       Wynetta Emery, PA-C 03/01/13 1653  Medical screening examination/treatment/procedure(s) were performed by non-physician practitioner and as supervising physician I was immediately available for consultation/collaboration.  Derwood Kaplan, MD 03/02/13 2259

## 2013-02-27 NOTE — ED Provider Notes (Signed)
Medical screening examination/treatment/procedure(s) were performed by non-physician practitioner and as supervising physician I was immediately available for consultation/collaboration.  Phinehas Grounds, MD 02/27/13 0812 

## 2013-03-14 ENCOUNTER — Emergency Department (HOSPITAL_COMMUNITY)
Admission: EM | Admit: 2013-03-14 | Discharge: 2013-03-14 | Disposition: A | Discharge status: Home / Self Care | Payer: Self-pay | Attending: Emergency Medicine | Admitting: Emergency Medicine

## 2013-03-14 ENCOUNTER — Encounter (HOSPITAL_COMMUNITY): Payer: Self-pay | Admitting: Emergency Medicine

## 2013-03-14 DIAGNOSIS — K0889 Other specified disorders of teeth and supporting structures: Secondary | ICD-10-CM

## 2013-03-14 DIAGNOSIS — F17200 Nicotine dependence, unspecified, uncomplicated: Secondary | ICD-10-CM | POA: Insufficient documentation

## 2013-03-14 DIAGNOSIS — K089 Disorder of teeth and supporting structures, unspecified: Secondary | ICD-10-CM | POA: Insufficient documentation

## 2013-03-14 DIAGNOSIS — Z87448 Personal history of other diseases of urinary system: Secondary | ICD-10-CM | POA: Insufficient documentation

## 2013-03-14 DIAGNOSIS — Z872 Personal history of diseases of the skin and subcutaneous tissue: Secondary | ICD-10-CM | POA: Insufficient documentation

## 2013-03-14 MED ORDER — BUPIVACAINE-EPINEPHRINE PF 0.5-1:200000 % IJ SOLN
1.8000 mL | Freq: Once | INTRAMUSCULAR | Status: AC
  Administered 2013-03-14: 9 mg
  Filled 2013-03-14: qty 1.8

## 2013-03-14 MED ORDER — CLINDAMYCIN HCL 150 MG PO CAPS: 300.0000 mg | ORAL_CAPSULE | Freq: Three times a day (TID) | ORAL | Status: DC

## 2013-03-14 MED ORDER — OXYCODONE-ACETAMINOPHEN 5-325 MG PO TABS: 2.0000 | ORAL_TABLET | Freq: Four times a day (QID) | ORAL | Status: DC | PRN

## 2013-03-14 MED ORDER — OXYCODONE-ACETAMINOPHEN 5-325 MG PO TABS
2.0000 | ORAL_TABLET | Freq: Once | ORAL | Status: AC
  Administered 2013-03-14: 2 via ORAL
  Filled 2013-03-14: qty 2

## 2013-03-14 NOTE — ED Provider Notes (Signed)
History     CSN: [id number]  Arrival date & time 03/14/13  1517   First MD Initiated Contact with Patient 03/14/13 1525      Chief Complaint  Patient presents with  . Dental Pain    (Consider location/radiation/quality/duration/timing/severity/associated sxs/prior treatment) HPI Comments: Patient presents to the emergency department with a dental complaint. Symptoms began 3 days ago.  He has been seen here for the same in the past. The patient has tried to alleviate pain with percocet and penicillin.  Pain rated at a 10/10, characterized as throbbing in nature and located right upper and lower molars. Patient denies fever, night sweats, chills, difficulty swallowing or opening mouth, SOB, nuchal rigidity or decreased ROM of neck.  Patient does not have a dentist and requests a resource guide at discharge.   The history is provided by the patient. No language interpreter was used.    Past Medical History  Diagnosis Date  . Ulcer   . Epididymitis     Past Surgical History  Procedure Laterality Date  . Knee arthroscopy      No family history on file.  History  Substance Use Topics  . Smoking status: Current Every Day Smoker -- 0.50 packs/day for 10 years    Types: Cigarettes  . Smokeless tobacco: Not on file  . Alcohol Use: No      Review of Systems  All other systems reviewed and are negative.    Allergies  Morphine and related; Vancomycin; and Vicodin  Home Medications   Current Outpatient Rx  Name  Route  Sig  Dispense  Refill  . ibuprofen (ADVIL,MOTRIN) 200 MG tablet   Oral   Take 200 mg by mouth every 6 (six) hours as needed for pain.         . traMADol (ULTRAM) 50 MG tablet   Oral   Take 50 mg by mouth every 6 (six) hours as needed. For pain           There were no vitals taken for this visit.  Physical Exam  Nursing note and vitals reviewed. Constitutional: He is oriented to person, place, and time. He appears well-developed and  well-nourished.  HENT:  Head: Normocephalic and atraumatic.  Mouth/Throat:    Poor dentition throughout.  Affected tooth as diagrammed.  No signs of peritonsillar or tonsillar abscess.  No signs of gingival abscess. Oropharynx is clear and without exudates.  Uvula is midline.  Airway is intact. No signs of Ludwig's angina.   Eyes: Conjunctivae and EOM are normal.  Neck: Normal range of motion.  Cardiovascular: Normal rate.   Pulmonary/Chest: Effort normal.  Abdominal: He exhibits no distension.  Musculoskeletal: Normal range of motion.  Neurological: He is alert and oriented to person, place, and time.  Skin: Skin is dry.  Psychiatric: He has a normal mood and affect. His behavior is normal. Judgment and thought content normal.    ED Course  Dental Date/Time: 03/14/2013 4:19 PM Performed by: Roxy Horseman Authorized by: Roxy Horseman Consent: Verbal consent obtained. Risks and benefits: risks, benefits and alternatives were discussed Consent given by: patient Patient understanding: patient states understanding of the procedure being performed Patient consent: the patient's understanding of the procedure matches consent given Procedure consent: procedure consent matches procedure scheduled Relevant documents: relevant documents present and verified Test results: test results available and properly labeled Imaging studies: imaging studies available Required items: required blood products, implants, devices, and special equipment available Patient identity confirmed: verbally with patient Preparation: Patient  was prepped and draped in the usual sterile fashion. Local anesthesia used: yes Local anesthetic: bupivacaine 0.5% with epinephrine Patient sedated: no Patient tolerance: Patient tolerated the procedure well with no immediate complications.   (including critical care time)  Labs Reviewed - No data to display No results found.   1. Pain, dental       MDM    Uncomplicated dental pain 2/2 tooth impaction.  No abcsess or signs of infection.  No signs of ludwig's angina.  Patient given percocet and dental block with good relief.  Will refer to oral surgery.        Roxy Horseman, PA-C 03/14/13 [phone]

## 2013-03-14 NOTE — ED Notes (Signed)
Pharmacy phone call transferred to [facility]

## 2013-03-14 NOTE — ED Provider Notes (Signed)
Medical screening examination/treatment/procedure(s) were performed by non-physician practitioner and as supervising physician I was immediately available for consultation/collaboration.   Curties Conigliaro, MD 03/14/13 1628 

## 2013-03-14 NOTE — ED Notes (Signed)
Pt c/o dental pain x 3 days.

## 2013-03-18 ENCOUNTER — Encounter (HOSPITAL_COMMUNITY): Payer: Self-pay | Admitting: Emergency Medicine

## 2013-03-18 ENCOUNTER — Emergency Department (HOSPITAL_COMMUNITY)
Admission: EM | Admit: 2013-03-18 | Discharge: 2013-03-18 | Disposition: A | Discharge status: Home / Self Care | Payer: Self-pay | Attending: Emergency Medicine | Admitting: Emergency Medicine

## 2013-03-18 DIAGNOSIS — Z872 Personal history of diseases of the skin and subcutaneous tissue: Secondary | ICD-10-CM | POA: Insufficient documentation

## 2013-03-18 DIAGNOSIS — Z792 Long term (current) use of antibiotics: Secondary | ICD-10-CM | POA: Insufficient documentation

## 2013-03-18 DIAGNOSIS — Z87448 Personal history of other diseases of urinary system: Secondary | ICD-10-CM | POA: Insufficient documentation

## 2013-03-18 DIAGNOSIS — K089 Disorder of teeth and supporting structures, unspecified: Secondary | ICD-10-CM | POA: Insufficient documentation

## 2013-03-18 DIAGNOSIS — F17200 Nicotine dependence, unspecified, uncomplicated: Secondary | ICD-10-CM | POA: Insufficient documentation

## 2013-03-18 DIAGNOSIS — K0889 Other specified disorders of teeth and supporting structures: Secondary | ICD-10-CM

## 2013-03-18 DIAGNOSIS — K029 Dental caries, unspecified: Secondary | ICD-10-CM | POA: Insufficient documentation

## 2013-03-18 MED ORDER — OXYCODONE-ACETAMINOPHEN 5-325 MG PO TABS
2.0000 | ORAL_TABLET | Freq: Once | ORAL | Status: AC
  Administered 2013-03-18: 2 via ORAL
  Filled 2013-03-18: qty 2

## 2013-03-18 MED ORDER — IBUPROFEN 600 MG PO TABS: 600.0000 mg | ORAL_TABLET | Freq: Four times a day (QID) | ORAL | Status: DC | PRN

## 2013-03-18 MED ORDER — OXYCODONE-ACETAMINOPHEN 5-325 MG PO TABS: 1.0000 | ORAL_TABLET | Freq: Four times a day (QID) | ORAL | Status: DC | PRN

## 2013-03-18 NOTE — ED Notes (Signed)
Pt states he has been having dental pain for past month.  Pt states he has been seen before for this.  Ran out of pain medicine, has dental appointment on Thursday.  Pt states he has pain on R upper and lower jaw

## 2013-03-18 NOTE — ED Provider Notes (Signed)
History  This chart was scribed for non-physician practitioner Renne Crigler, PA-C working with Nelia Shi, MD, by Candelaria Stagers, ED Scribe. This patient was seen in room WTR6/WTR6 and the patient's care was started at 6:50 PM   CSN: [id number]  Arrival date & time 03/18/13  1831   First MD Initiated Contact with Patient 03/18/13 1836      Chief Complaint  Patient presents with  . Dental Pain     The history is provided by the patient. No language interpreter was used.   Jonathan Duke is a 32 y.o. male with an upcoming oral surgery appointment in 4 days -- who presents to the Emergency Department complaining of constant gradually worsening right lower and right upper dental pain that started about one month ago.  Pt was seen in the ED multiple times and  prescribed pain medication and antibiotics.  He reports he is now out of pain medication.  He has taken ibuprofen over the last two days with no relief.  Pt denies trouble breathing or swallowing.  He is scheduled to see Dr. Barbette Merino.  The onset of this condition was acute. The course is constant. Aggravating factors: none. Alleviating factors: none.    Past Medical History  Diagnosis Date  . Ulcer   . Epididymitis     Past Surgical History  Procedure Laterality Date  . Knee arthroscopy      History reviewed. No pertinent family history.  History  Substance Use Topics  . Smoking status: Current Every Day Smoker -- 0.50 packs/day for 10 years    Types: Cigarettes  . Smokeless tobacco: Not on file  . Alcohol Use: No      Review of Systems  Constitutional: Negative for fever.  HENT: Positive for dental problem (right lower and upper dental pain). Negative for ear pain, sore throat, facial swelling, trouble swallowing and neck pain.   Respiratory: Negative for shortness of breath and stridor.   Skin: Negative for color change.  Neurological: Negative for headaches.    Allergies  Morphine and related;  Vancomycin; and Vicodin  Home Medications   Current Outpatient Rx  Name  Route  Sig  Dispense  Refill  . ibuprofen (ADVIL,MOTRIN) 200 MG tablet   Oral   Take 600 mg by mouth every 6 (six) hours as needed for pain.          Marland Kitchen penicillin v potassium (VEETID) 500 MG tablet   Oral   Take 500 mg by mouth 4 (four) times daily.         Marland Kitchen oxyCODONE-acetaminophen (PERCOCET/ROXICET) 5-325 MG per tablet   Oral   Take 2 tablets by mouth every 6 (six) hours as needed for pain.   15 tablet   0     BP 141/78  Pulse 86  Temp(Src) 97.8 F (36.6 C) (Oral)  Resp 16  SpO2 100%  Physical Exam  Nursing note and vitals reviewed. Constitutional: He appears well-developed and well-nourished. No distress.  HENT:  Head: Normocephalic and atraumatic. No trismus in the jaw.  Right Ear: Tympanic membrane, external ear and ear canal normal.  Left Ear: Tympanic membrane, external ear and ear canal normal.  Nose: Nose normal.  Mouth/Throat: Uvula is midline, oropharynx is clear and moist and mucous membranes are normal. Abnormal dentition. Dental caries present. No dental abscesses or edematous. No tonsillar abscesses.  Tenderness to palpation, no erythema or swelling.  Mainly to the third right upper and lower molars.  No ludwig's angina.  Eyes: EOM are normal. Pupils are equal, round, and reactive to light.  Neck: Normal range of motion. Neck supple. No tracheal deviation present.  No neck swelling or Lugwig's angina  Cardiovascular: Normal rate.   Pulmonary/Chest: Effort normal. No respiratory distress.  Musculoskeletal: Normal range of motion.  Neurological: He is alert.  Skin: Skin is warm and dry.  Psychiatric: He has a normal mood and affect. His behavior is normal.    ED Course  Procedures   DIAGNOSTIC STUDIES: Oxygen Saturation is 100% on room air, normal by my interpretation.    COORDINATION OF CARE:  6:52 PM Discussed course of care with pt which includes pain medication.   Advised pt to keep dental appointment later this week.  Pt understands and agrees.   Labs Reviewed - No data to display No results found.   1. Pain, dental     7:14 PM Patient seen and examined. Work-up initiated. Medications ordered.   Vital signs reviewed and are as follows: Filed Vitals:   03/18/13 1848  BP: 141/78  Pulse: 86  Temp: 97.8 F (36.6 C)  Resp: 16    Patient counseled to take prescribed medications as directed, return with worsening facial or neck swelling, and to follow-up with their dentist as soon as possible.   Patient counseled on use of narcotic pain medications. Counseled not to combine these medications with others containing tylenol. Urged not to drink alcohol, drive, or perform any other activities that requires focus while taking these medications. The patient verbalizes understanding and agrees with the plan.    MDM  Patient with toothache.  No gross abscess.  Exam unconcerning for Ludwig's angina or other deep tissue infection in neck.  Will treat with penicillin and pain medicine.  Urged patient to follow-up with Dr. Barbette Merino as planned.     I personally performed the services described in this documentation, which was scribed in my presence. The recorded information has been reviewed and is accurate.         Renne Crigler, PA-C 03/18/13 1919

## 2013-03-19 NOTE — ED Provider Notes (Signed)
Medical screening examination/treatment/procedure(s) were performed by non-physician practitioner and as supervising physician I was immediately available for consultation/collaboration.   Nelia Shi, MD 03/19/13 [phone]

## 2013-03-21 ENCOUNTER — Emergency Department: Payer: Self-pay | Admitting: Unknown Physician Specialty

## 2013-03-21 ENCOUNTER — Emergency Department (HOSPITAL_COMMUNITY)
Admission: EM | Admit: 2013-03-21 | Discharge: 2013-03-21 | Disposition: A | Discharge status: Home / Self Care | Payer: Self-pay | Attending: Emergency Medicine | Admitting: Emergency Medicine

## 2013-03-21 DIAGNOSIS — Z872 Personal history of diseases of the skin and subcutaneous tissue: Secondary | ICD-10-CM | POA: Insufficient documentation

## 2013-03-21 DIAGNOSIS — F17200 Nicotine dependence, unspecified, uncomplicated: Secondary | ICD-10-CM | POA: Insufficient documentation

## 2013-03-21 DIAGNOSIS — K089 Disorder of teeth and supporting structures, unspecified: Secondary | ICD-10-CM | POA: Insufficient documentation

## 2013-03-21 DIAGNOSIS — K0889 Other specified disorders of teeth and supporting structures: Secondary | ICD-10-CM

## 2013-03-21 DIAGNOSIS — Z87448 Personal history of other diseases of urinary system: Secondary | ICD-10-CM | POA: Insufficient documentation

## 2013-03-21 MED ORDER — OXYCODONE-ACETAMINOPHEN 5-325 MG PO TABS: 1.0000 | ORAL_TABLET | Freq: Four times a day (QID) | ORAL | Status: DC | PRN

## 2013-03-21 MED ORDER — OXYCODONE-ACETAMINOPHEN 5-325 MG PO TABS
1.0000 | ORAL_TABLET | Freq: Once | ORAL | Status: AC
  Administered 2013-03-21: 2 via ORAL
  Filled 2013-03-21: qty 2

## 2013-03-21 NOTE — ED Notes (Signed)
Pt c/o R side "wisdom tooth" pain. States pain is in upper and lower back of R side of mouth. Pt has some swelling to R side of face. Pt states pain has been going on for the past month. Pt has a dentist, but has been unable to get in to see dentist. Pt has taken Tramadol, Motrin for pain, but it is not helping. Pt also states he has taken some penicillin and amoxicillin that he had "left over". Pt with no acute distress. Pt BIB wife.

## 2013-03-21 NOTE — ED Notes (Signed)
Pt has a ride home.  

## 2013-03-21 NOTE — ED Provider Notes (Signed)
History     CSN: [id number]  Arrival date & time 03/21/13  1626   First MD Initiated Contact with Patient 03/21/13 1726      Chief Complaint  Patient presents with  . Dental Pain    (Consider location/radiation/quality/duration/timing/severity/associated sxs/prior treatment) HPI  Pt is a 32 yo M presenting for constant sharp right sided 10/10 dental pain that has been going on for three weeks w/o radiation. Pt had been taking Percocet prescribed from previous ED visit on 03/18/2013, but has run out and pain is back. Aggravating factors: cold liquids and eating. Alleviating: none. Pt has not been in to see Dr. Barbette Merino as advised to at previous ED visit d/t insurance and cost reason.   Past Medical History  Diagnosis Date  . Ulcer   . Epididymitis     Past Surgical History  Procedure Laterality Date  . Knee arthroscopy      No family history on file.  History  Substance Use Topics  . Smoking status: Current Every Day Smoker -- 0.50 packs/day for 10 years    Types: Cigarettes  . Smokeless tobacco: Not on file  . Alcohol Use: No      Review of Systems  Constitutional: Negative for fever and chills.  HENT: Positive for dental problem. Negative for sore throat, drooling, trouble swallowing and voice change.   Eyes: Negative for visual disturbance.  Respiratory: Negative for cough and shortness of breath.   Cardiovascular: Negative for chest pain.  Gastrointestinal: Negative for abdominal pain.  Skin: Negative.   Neurological: Negative for headaches.    Allergies  Morphine and related; Vancomycin; and Vicodin  Home Medications   Current Outpatient Rx  Name  Route  Sig  Dispense  Refill  . amoxicillin (AMOXIL) 500 MG capsule   Oral   Take 500 mg by mouth 3 (three) times daily.         Marland Kitchen ibuprofen (ADVIL,MOTRIN) 200 MG tablet   Oral   Take 600 mg by mouth every 6 (six) hours as needed for pain.          Marland Kitchen oxyCODONE-acetaminophen (PERCOCET/ROXICET) 5-325 MG  per tablet   Oral   Take 1-2 tablets by mouth every 6 (six) hours as needed for pain.   12 tablet   0   . penicillin v potassium (VEETID) 500 MG tablet   Oral   Take 500 mg by mouth 4 (four) times daily.           BP 142/87  Pulse 112  Temp(Src) 98.7 F (37.1 C) (Oral)  Resp 16  SpO2 97%  Physical Exam  Constitutional: He is oriented to person, place, and time. He appears well-developed and well-nourished.  HENT:  Head: Normocephalic and atraumatic. No trismus in the jaw.  Mouth/Throat: Uvula is midline, oropharynx is clear and moist and mucous membranes are normal. He does not have dentures. Abnormal dentition. Dental caries present. No dental abscesses or edematous.  Tender 3rd Rt upper and lower molars w/o erythema or tooth injury.   Eyes: EOM are normal. Pupils are equal, round, and reactive to light.  Cardiovascular: Normal rate, regular rhythm and normal heart sounds.   Pulmonary/Chest: Effort normal and breath sounds normal.  Abdominal: Soft. Bowel sounds are normal.  Neurological: He is alert and oriented to person, place, and time.  Skin: Skin is warm and dry.  Psychiatric: He has a normal mood and affect.    ED Course  Procedures (including critical care time)  Labs Reviewed -  No data to display No results found.   1. Pain, dental       MDM  Patient with toothache.  No gross abscess.  Exam unconcerning for Ludwig's angina or spread of infection.  Will treat with pain medicine as patient received Abx already from previous ED visit.  Urged patient to follow-up with oral surgeon as advised.           Jeannetta Ellis, PA-C 03/21/13 1824

## 2013-03-22 NOTE — ED Provider Notes (Signed)
Medical screening examination/treatment/procedure(s) were performed by non-physician practitioner and as supervising physician I was immediately available for consultation/collaboration.  Sherlonda Flater, MD 03/22/13 0037 

## 2013-03-24 ENCOUNTER — Emergency Department (HOSPITAL_COMMUNITY)
Admission: EM | Admit: 2013-03-24 | Discharge: 2013-03-24 | Disposition: A | Discharge status: Home / Self Care | Payer: Self-pay | Attending: Emergency Medicine | Admitting: Emergency Medicine

## 2013-03-24 ENCOUNTER — Encounter (HOSPITAL_COMMUNITY): Payer: Self-pay | Admitting: *Deleted

## 2013-03-24 DIAGNOSIS — Z872 Personal history of diseases of the skin and subcutaneous tissue: Secondary | ICD-10-CM | POA: Insufficient documentation

## 2013-03-24 DIAGNOSIS — K029 Dental caries, unspecified: Secondary | ICD-10-CM | POA: Insufficient documentation

## 2013-03-24 DIAGNOSIS — Z87448 Personal history of other diseases of urinary system: Secondary | ICD-10-CM | POA: Insufficient documentation

## 2013-03-24 DIAGNOSIS — K0889 Other specified disorders of teeth and supporting structures: Secondary | ICD-10-CM

## 2013-03-24 DIAGNOSIS — F17200 Nicotine dependence, unspecified, uncomplicated: Secondary | ICD-10-CM | POA: Insufficient documentation

## 2013-03-24 MED ORDER — TRAMADOL HCL 50 MG PO TABS: 50.0000 mg | ORAL_TABLET | Freq: Four times a day (QID) | ORAL | Status: DC | PRN

## 2013-03-24 NOTE — ED Provider Notes (Signed)
History     CSN: [id number]  Arrival date & time 03/24/13  1653   First MD Initiated Contact with Patient 03/24/13 1658      Chief Complaint  Patient presents with  . Facial Pain    (Consider location/radiation/quality/duration/timing/severity/associated sxs/prior treatment) HPI Comments: 32 y/o male presents to the ED complaining of right sided facial pain for the past month, seen in the ED for the same multiple times. Advised at each visit to see oral surgeon, states he was just paid and can schedule an appointment Monday with Dr. Barbette Merino. Ran out of pain medication. Pain is from wisdom right upper and lower wisdom teeth, sharp, constant, 10/10, worse with anything cold. Pain only relieved by percocet. Denies fever, chills, difficulty swallowing, facial swelling. Each time he has been seen in the ED he receives percocet and advised to f/u with oral surgeon.   The history is provided by the patient and the spouse.    Past Medical History  Diagnosis Date  . Ulcer   . Epididymitis     Past Surgical History  Procedure Laterality Date  . Knee arthroscopy      No family history on file.  History  Substance Use Topics  . Smoking status: Current Every Day Smoker -- 0.50 packs/day for 10 years    Types: Cigarettes  . Smokeless tobacco: Not on file  . Alcohol Use: No      Review of Systems  Constitutional: Negative for fever and chills.  HENT: Positive for dental problem. Negative for trouble swallowing.   All other systems reviewed and are negative.    Allergies  Morphine and related; Vancomycin; and Vicodin  Home Medications   Current Outpatient Rx  Name  Route  Sig  Dispense  Refill  . ibuprofen (ADVIL,MOTRIN) 200 MG tablet   Oral   Take 600 mg by mouth every 6 (six) hours as needed for pain.          Marland Kitchen oxyCODONE-acetaminophen (PERCOCET/ROXICET) 5-325 MG per tablet   Oral   Take 1-2 tablets by mouth every 6 (six) hours as needed for pain.   15 tablet   0   . penicillin v potassium (VEETID) 500 MG tablet   Oral   Take 500 mg by mouth 4 (four) times daily. Ends 03/25/13.         Marland Kitchen amoxicillin (AMOXIL) 500 MG capsule   Oral   Take 500 mg by mouth 3 (three) times daily.         . traMADol (ULTRAM) 50 MG tablet   Oral   Take 1 tablet (50 mg total) by mouth every 6 (six) hours as needed for pain.   15 tablet   0     BP 141/81  Pulse 88  Temp(Src) 98.2 F (36.8 C) (Oral)  Resp 16  SpO2 96%  Physical Exam  Nursing note and vitals reviewed. Constitutional: He is oriented to person, place, and time. He appears well-developed and well-nourished. No distress.  HENT:  Head: Normocephalic and atraumatic.  Mouth/Throat: Uvula is midline, oropharynx is clear and moist and mucous membranes are normal. Abnormal dentition. Dental caries present.    Third upper and lower molars tender to palpation. Upper 3rd molar impacted. No surrounding erythema, edema or abscess.  Eyes: Conjunctivae are normal.  Neck: Normal range of motion. Neck supple.  Cardiovascular: Normal rate, regular rhythm and normal heart sounds.   Pulmonary/Chest: Effort normal and breath sounds normal.  Musculoskeletal: Normal range of motion. He exhibits  no edema.  Lymphadenopathy:    He has no cervical adenopathy.  Neurological: He is alert and oriented to person, place, and time.  Skin: Skin is warm and dry. He is not diaphoretic.  Psychiatric: He has a normal mood and affect. His behavior is normal.    ED Course  Procedures (including critical care time)  Labs Reviewed - No data to display No results found.   1. Pain, dental       MDM  32 y/o male with dental pain requesting pain medication. He receives percocet each time he comes to the ED, each time he is told to f/u with oral surgeon. I will give tramadol for pain. Discussed importance of seeing oral surgeon and he cannot keep returning for narcotics. No abscess, signs of infection.         Trevor Mace, PA-C 03/24/13 1744

## 2013-03-24 NOTE — ED Notes (Signed)
Per pt wife: pt has had right sided facial pain for past month d/t wisdom top and bottom right sided wisdom teeth, needs to be extracted. Oral surgeon called on Friday, no answer, wife will call back Monday. Pt unable to have teeth extracted d/t financial situation, but will be able to have surgery as soon as they get in contact with surgeon. Has been seen here for same symptoms over past month. Wife states pt usually gets rx, but when he runs out pain becomes unbearable.

## 2013-03-24 NOTE — ED Provider Notes (Signed)
Medical screening examination/treatment/procedure(s) were performed by non-physician practitioner and as supervising physician I was immediately available for consultation/collaboration.   Sung Renton, MD 03/24/13 1938 

## 2013-05-09 ENCOUNTER — Emergency Department (HOSPITAL_COMMUNITY): Payer: Self-pay

## 2013-05-09 ENCOUNTER — Encounter (HOSPITAL_COMMUNITY): Payer: Self-pay | Admitting: Emergency Medicine

## 2013-05-09 ENCOUNTER — Emergency Department (HOSPITAL_COMMUNITY)
Admission: EM | Admit: 2013-05-09 | Discharge: 2013-05-09 | Disposition: A | Discharge status: Home / Self Care | Payer: Self-pay | Attending: Emergency Medicine | Admitting: Emergency Medicine

## 2013-05-09 DIAGNOSIS — Z8711 Personal history of peptic ulcer disease: Secondary | ICD-10-CM | POA: Insufficient documentation

## 2013-05-09 DIAGNOSIS — Z872 Personal history of diseases of the skin and subcutaneous tissue: Secondary | ICD-10-CM | POA: Insufficient documentation

## 2013-05-09 DIAGNOSIS — R55 Syncope and collapse: Secondary | ICD-10-CM | POA: Insufficient documentation

## 2013-05-09 DIAGNOSIS — J3489 Other specified disorders of nose and nasal sinuses: Secondary | ICD-10-CM | POA: Insufficient documentation

## 2013-05-09 DIAGNOSIS — F17200 Nicotine dependence, unspecified, uncomplicated: Secondary | ICD-10-CM | POA: Insufficient documentation

## 2013-05-09 DIAGNOSIS — Z87448 Personal history of other diseases of urinary system: Secondary | ICD-10-CM | POA: Insufficient documentation

## 2013-05-09 DIAGNOSIS — K0510 Chronic gingivitis, plaque induced: Secondary | ICD-10-CM | POA: Insufficient documentation

## 2013-05-09 DIAGNOSIS — R51 Headache: Secondary | ICD-10-CM | POA: Insufficient documentation

## 2013-05-09 DIAGNOSIS — Z79899 Other long term (current) drug therapy: Secondary | ICD-10-CM | POA: Insufficient documentation

## 2013-05-09 HISTORY — DX: Dental caries, unspecified: K02.9

## 2013-05-09 MED ORDER — OXYCODONE-ACETAMINOPHEN 5-325 MG PO TABS
1.0000 | ORAL_TABLET | Freq: Once | ORAL | Status: AC
  Administered 2013-05-09: 1 via ORAL
  Filled 2013-05-09: qty 1

## 2013-05-09 MED ORDER — MAGIC MOUTHWASH W/LIDOCAINE: ORAL | Status: DC

## 2013-05-09 NOTE — ED Notes (Signed)
PT. REPORTS UPPER GUM PAIN WITH SORENESS/ BLISTERS AT UPPER GUMS FOR SEVERAL DAYS .

## 2013-05-09 NOTE — ED Notes (Signed)
Patient transported to CT in wheelchair.

## 2013-05-09 NOTE — ED Notes (Signed)
Crackers and water given.

## 2013-05-09 NOTE — ED Provider Notes (Signed)
History    This chart was scribed for non-physician practitioner working with Doug Sou, MD by Donne Anon, ED Scribe. This patient was seen in room TR07C/TR07C and the patient's care was started at 1946.  CSN: [id number] Arrival date & time 05/09/13  4098  First MD Initiated Contact with Patient 05/09/13 1946     Chief Complaint  Patient presents with  . Dental Pain    The history is provided by the patient and medical records. No language interpreter was used.    HPI Comments: Jonathan Duke is a 32 y.o. male who presents to the Emergency Department complaining of several days of gradual onset, gradually worsening, constant upper gum pain with associated swelling and blisters. Pt has been here 7 times in the past 6 months for dental pain. He has his wisdom teeth pulled 1 month ago and he last saw his dentist 2 weeks ago. He states that he has a hole in the upper right side of his mouth that was present before he had his teeth pulled that is painful. He says the dentist evaluated it and said it was normal.  He reports associated yellow nasal discharge and a syncope episode today at work. He has been on several courses of antibiotics recently, and his last course finished 4 days ago. He denies nasal pain or pressure.  Pt is a current everyday smoker (.5 pack/day) but denies alcohol use. He does not currently have a PCP.   Past Medical History  Diagnosis Date  . Ulcer   . Epididymitis   . Dental caries    Past Surgical History  Procedure Laterality Date  . Knee arthroscopy     No family history on file. History  Substance Use Topics  . Smoking status: Current Every Day Smoker -- 0.50 packs/day for 10 years    Types: Cigarettes  . Smokeless tobacco: Not on file  . Alcohol Use: No    Review of Systems  HENT: Positive for congestion and dental problem. Negative for sinus pressure.   All other systems reviewed and are negative.    Allergies  Morphine and related;  Vancomycin; and Vicodin  Home Medications   Current Outpatient Rx  Name  Route  Sig  Dispense  Refill  . amoxicillin (AMOXIL) 500 MG capsule   Oral   Take 500 mg by mouth 3 (three) times daily.         Marland Kitchen ibuprofen (ADVIL,MOTRIN) 200 MG tablet   Oral   Take 600 mg by mouth every 6 (six) hours as needed for pain.          Marland Kitchen oxyCODONE-acetaminophen (PERCOCET/ROXICET) 5-325 MG per tablet   Oral   Take 1-2 tablets by mouth every 6 (six) hours as needed for pain.   15 tablet   0   . penicillin v potassium (VEETID) 500 MG tablet   Oral   Take 500 mg by mouth 4 (four) times daily. Ends 03/25/13.         . traMADol (ULTRAM) 50 MG tablet   Oral   Take 1 tablet (50 mg total) by mouth every 6 (six) hours as needed for pain.   15 tablet   0    BP 136/92  Pulse 95  Temp(Src) 98.9 F (37.2 C) (Oral)  Resp 20  SpO2 92%  Physical Exam  Nursing note and vitals reviewed. Constitutional: He appears well-developed and well-nourished. No distress.  HENT:  Head: Normocephalic and atraumatic.  Mouth/Throat: Oropharynx is clear and moist.  No oropharyngeal exudate.  Poor dentition. Erythema over the upper gum line with several ulcerations. No obvious abscesses. Tongue with white coating. Pharynx normal. Tender over right maxillary sinus.  Eyes: Conjunctivae are normal.  Neck: Neck supple. No tracheal deviation present.  Cardiovascular: Normal rate.   Pulmonary/Chest: Effort normal. No respiratory distress.  Musculoskeletal: Normal range of motion.  Neurological: He is alert.  Skin: Skin is warm and dry.  Psychiatric: He has a normal mood and affect. His behavior is normal.    ED Course  Procedures (including critical care time) DIAGNOSTIC STUDIES: Oxygen Saturation is 92% on RA, low by my interpretation.    COORDINATION OF CARE: 7:50 PM Discussed treatment plan which includes head CT with pt at bedside and pt agreed to plan.   9:58 PM Rechecked pt. Informed of negative  CT.Will discharge home.   Labs Reviewed - No data to display Ct Maxillofacial Wo Cm  05/09/2013   *RADIOLOGY REPORT*  Clinical Data: Right maxillary pain, drainage, recent was and tubes removal, nasal discharge  CT MAXILLOFACIAL WITHOUT CONTRAST  Technique:  Multidetector CT imaging of the maxillofacial structures was performed. Multiplanar CT image reconstructions were also generated.  Comparison: None.  Findings: Facial bones appear intact.  Specifically, the mandible, skull base, zygomas, pterygoid plates, maxilla, nasal septum, nasal bones, and orbits all appear intact.  No facial bony trauma or fracture.  Mastoids and sinuses are clear.  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  No facial asymmetry or definite soft tissue fluid collection in these regions.  Visualized intracranial contents unremarkable.  Symmetric orbits.  IMPRESSION: Postop changes right posterior mandible and maxilla from recent wisdom tooth removal.  Soft tissue attenuation in the osseous defects appears nonspecific by noncontrast CT.  No facial bony trauma or fracture.   Original Report Authenticated By: Judie Petit. Shick, M.D.   1. Facial pain   2. Gingivitis     MDM  Pt with continues pain to the right upper teeth, after the extraction a month ago. No findings on exam other than widespread gingivitis, and possible thrush, pt has white covered tongue. CT maxillofacial was obtained to r/o dental abscess vs sinus disease given maxillary pain and it is negative other than soft tissue attenuation in the right posterior maxilla which was non specific on this non contrasted CT. Pt advised that he must go back and see his oral surgeon. Given percocet at home. Magic mouth wash at home. Follow up as soon as able. Pt otherwise afebrile, non toxic.   Filed Vitals:   05/09/13 2211  BP: 131/91  Pulse: 69  Temp:   Resp: 18     I personally performed the services described in  this documentation, which was scribed in my presence. The recorded information has been reviewed and is accurate.   Lottie Mussel, PA-C 05/09/13 2356

## 2013-05-09 NOTE — ED Notes (Signed)
Pt reports he has been on multiple rounds of antibiotics and now has ulcers all in his mouth. Reports dentist that pulled his wisdom teeth say there is nothing wrong. Pt reports pain, sweatiness, and is insistent something is wrong.

## 2013-05-09 NOTE — ED Notes (Signed)
MD at bedside. 

## 2013-05-09 NOTE — ED Notes (Signed)
PT ambulated with baseline gait; VSS; A&Ox3; no signs of distress; respirations even and unlabored; skin warm and dry; no questions upon discharge.  

## 2013-05-10 NOTE — ED Provider Notes (Signed)
Medical screening examination/treatment/procedure(s) were performed by non-physician practitioner and as supervising physician I was immediately available for consultation/collaboration.  Marji Kuehnel, MD 05/10/13 0121 

## 2013-06-13 ENCOUNTER — Emergency Department (HOSPITAL_COMMUNITY)
Admission: EM | Admit: 2013-06-13 | Discharge: 2013-06-13 | Disposition: A | Discharge status: Home / Self Care | Payer: Self-pay | Attending: Emergency Medicine | Admitting: Emergency Medicine

## 2013-06-13 ENCOUNTER — Encounter (HOSPITAL_COMMUNITY): Payer: Self-pay | Admitting: *Deleted

## 2013-06-13 DIAGNOSIS — Z8719 Personal history of other diseases of the digestive system: Secondary | ICD-10-CM | POA: Insufficient documentation

## 2013-06-13 DIAGNOSIS — Z87448 Personal history of other diseases of urinary system: Secondary | ICD-10-CM | POA: Insufficient documentation

## 2013-06-13 DIAGNOSIS — R51 Headache: Secondary | ICD-10-CM | POA: Insufficient documentation

## 2013-06-13 DIAGNOSIS — F17200 Nicotine dependence, unspecified, uncomplicated: Secondary | ICD-10-CM | POA: Insufficient documentation

## 2013-06-13 MED ORDER — OXYCODONE-ACETAMINOPHEN 5-325 MG PO TABS
1.0000 | ORAL_TABLET | Freq: Once | ORAL | Status: AC
  Administered 2013-06-13: 1 via ORAL
  Filled 2013-06-13: qty 1

## 2013-06-13 MED ORDER — TRAMADOL HCL 50 MG PO TABS: 50.0000 mg | ORAL_TABLET | Freq: Four times a day (QID) | ORAL | Status: AC | PRN

## 2013-06-13 NOTE — ED Notes (Signed)
Pt reports that gum pain started 3 weeks ago-- had visited [facility] at onset, was diagnosed with "thrush"; also states that CT scan was done, was told that he has "a big hole at the upper gum but no signs of infection.  Pt reports that he religiously followed discharge instructions on taking Magic Mouthwash, but pain persists; reports taking Ibuprofen but is not helping.

## 2013-06-13 NOTE — ED Provider Notes (Signed)
CSN: [id number]     Arrival date & time 06/13/13  0356 History     First MD Initiated Contact with Patient 06/13/13 [phone]     Chief Complaint  Patient presents with  . Dental Pain   (Consider location/radiation/quality/duration/timing/severity/associated sxs/prior Treatment) HPI  Jonathan Duke is a 32 y.o. male complaining of right upper palate gingival swelling and pain. Patient rates his pain at 10 out of 10 he says is exacerbated by eating. This is been going on for 2 months, states that he had molars removed approximately 6 weeks ago. Patient denies fever, nausea vomiting, shortness of breath, difficulty swallowing. Patient states he feels like his Jaws are locked. States he had a CAT scan which showed a hole in the bone. He states he was diagnosed with thrush, states that he is still feeling pain from where he received a local injection  months ago. States this pain as a distracting to him that he has lost custody of his child.  Past Medical History  Diagnosis Date  . Ulcer   . Epididymitis   . Dental caries    Past Surgical History  Procedure Laterality Date  . Knee arthroscopy     No family history on file. History  Substance Use Topics  . Smoking status: Current Every Day Smoker -- 0.50 packs/day for 10 years    Types: Cigarettes  . Smokeless tobacco: Not on file  . Alcohol Use: No    Review of Systems  10 systems reviewed and found to be negative, except as noted in the HPI   Allergies  Morphine and related; Vancomycin; and Vicodin  Home Medications   Current Outpatient Rx  Name  Route  Sig  Dispense  Refill  . benzocaine (ORAJEL) 10 % mucosal gel   Mouth/Throat   Use as directed 1 application in the mouth or throat as needed for pain.         Marland Kitchen ibuprofen (ADVIL,MOTRIN) 200 MG tablet   Oral   Take 600 mg by mouth every 6 (six) hours as needed for pain.         . traMADol (ULTRAM) 50 MG tablet   Oral   Take 1 tablet (50 mg total) by mouth every 6  (six) hours as needed for pain.   10 tablet   0    BP 134/78  Pulse 88  Temp(Src) 98.4 F (36.9 C) (Oral)  Resp 20  SpO2 97% Physical Exam  Nursing note and vitals reviewed. Constitutional: He is oriented to person, place, and time. He appears well-developed and well-nourished. No distress.  HENT:  Head: Normocephalic.  Mouth/Throat:     No trismus. Generally good  dentition, no gingival swelling, erythema or tenderness to palpation. Patient is handling their secretions. There is no tenderness to palpation or firmness underneath tongue bilaterally. No trismus.   No lesions to the oral mucosa, there is no tonsillar hypertrophy or exudate, no posterior pharynx injection. Able to palpate the affected area without exacerbation of pain.   Eyes: Conjunctivae and EOM are normal.  Cardiovascular: Normal rate.   Pulmonary/Chest: Effort normal. No stridor.  Musculoskeletal: Normal range of motion.  Neurological: He is alert and oriented to person, place, and time.  Psychiatric: He has a normal mood and affect.    ED Course   Procedures (including critical care time)  Labs Reviewed - No data to display No results found. 1. Facial pain     MDM   Filed Vitals:   06/13/13 0402  BP: 134/78  Pulse: 88  Temp: 98.4 F (36.9 C)  TempSrc: Oral  Resp: 20  SpO2: 97%     [name] is a 33 y.o. male well-known to the emergency room with 10 visits in the last 6 months for similar pain. Physical exam shows no abnormalities there is no sign of any infection or dental trauma. I've advised patient that we will not be giving him any prescription for narcotic pain medication without demonstration of pathology in the future. Patient advised to follow with ENT and establish primary care.  Patient requests a male provider because he states that he no providers cannot figure out what is going on with him.  Discussed case with attending who agrees with plan and stability to d/c to  home.    Medications  oxyCODONE-acetaminophen (PERCOCET/ROXICET) 5-325 MG per tablet 1 tablet (1 tablet Oral Given 06/13/13 0439)    Pt is hemodynamically stable, appropriate for, and amenable to discharge at this time. Pt verbalized understanding and agrees with care plan. All questions answered. Outpatient follow-up and specific return precautions discussed.    New Prescriptions   TRAMADOL (ULTRAM) 50 MG TABLET    Take 1 tablet (50 mg total) by mouth every 6 (six) hours as needed for pain.    Note: Portions of this report may have been transcribed using voice recognition software. Every effort was made to ensure accuracy; however, inadvertent computerized transcription errors may be present    Wynetta Emery, PA-C 06/13/13 [phone]

## 2013-06-13 NOTE — ED Notes (Signed)
Pt c/o upper gum pain; swelling; states feels like jaws locked; can't eat

## 2013-06-14 NOTE — ED Provider Notes (Signed)
Medical screening examination/treatment/procedure(s) were performed by non-physician practitioner and as supervising physician I was immediately available for consultation/collaboration.  Yulissa Needham, MD 06/14/13 0401 

## 2013-07-17 ENCOUNTER — Emergency Department: Payer: Self-pay | Admitting: Emergency Medicine

## 2015-02-22 IMAGING — CT CT MAXILLOFACIAL W/O CM
3 series · 16 of 47 positions shown, 19 images · non-contrast
Comparison: None.

CLINICAL DATA: Right maxillary pain, drainage, recent was and tubes
removal, nasal discharge

CT MAXILLOFACIAL WITHOUT CONTRAST
TECHNIQUE: Multidetector CT imaging of the maxillofacial
structures was performed. Multiplanar CT image reconstructions were
also generated.

[Series 3: orbit/facial 2.0 h30s · axial · 0.30mm/px · z∈[-220,-74]mm · 10 of 85 slices shown, 13 images]
[im 6/85  brain]
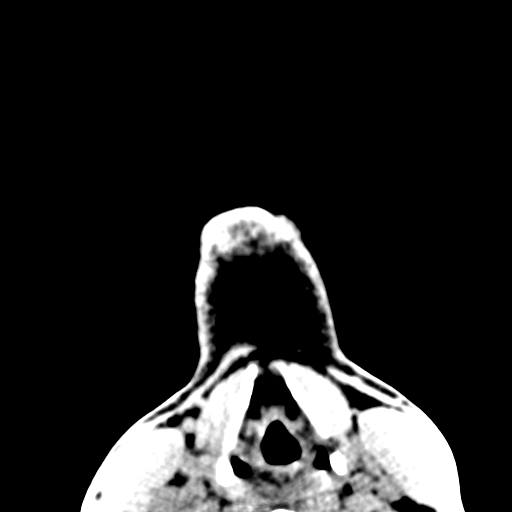
[im 6/85  bone]
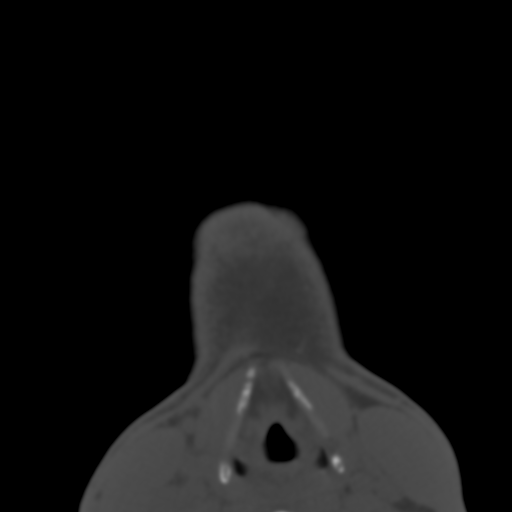
[im 15/85  bone]
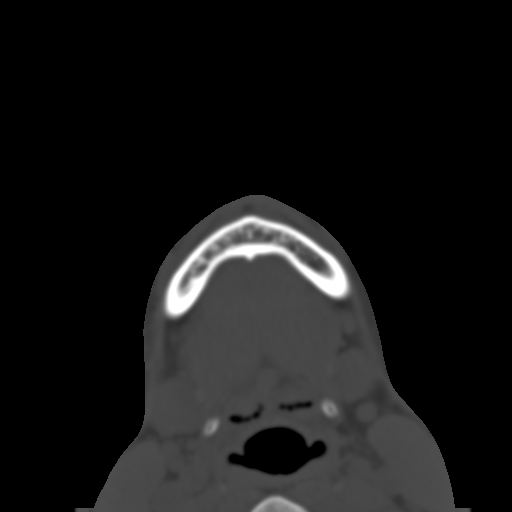
[im 24/85  bone]
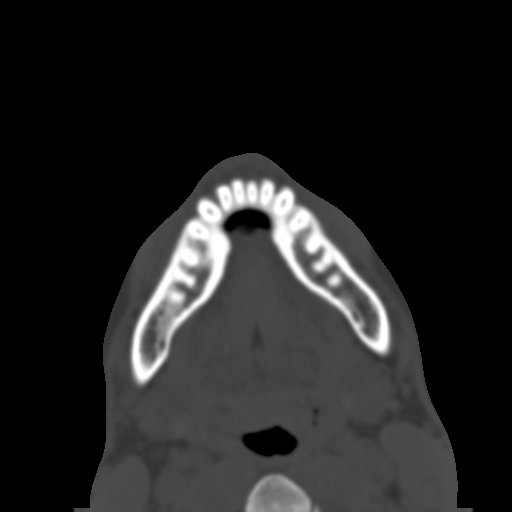
[im 29/85  bone]
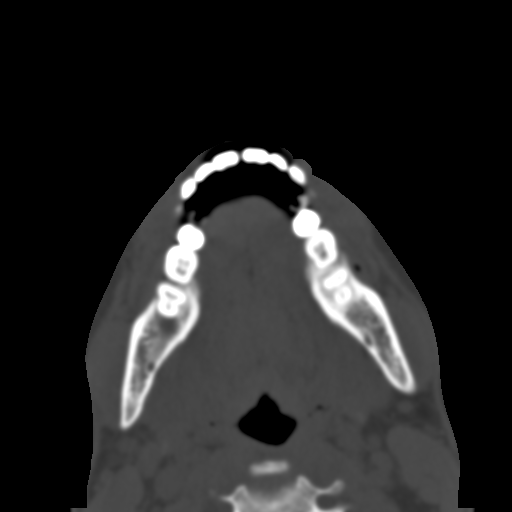
[im 38/85  brain]
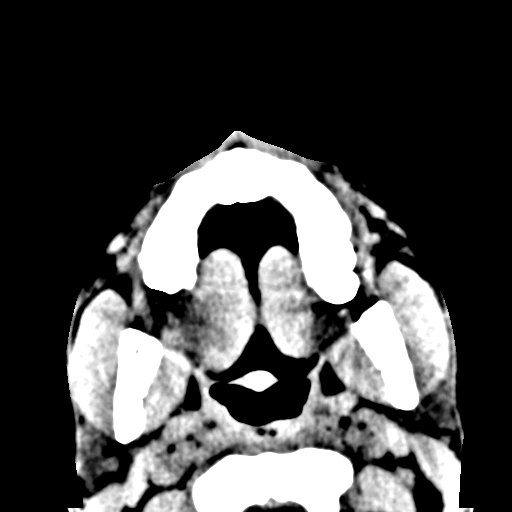
[im 38/85  bone]
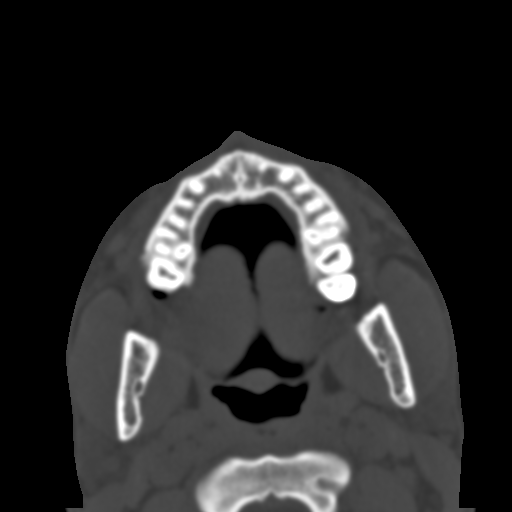
[im 47/85  bone]
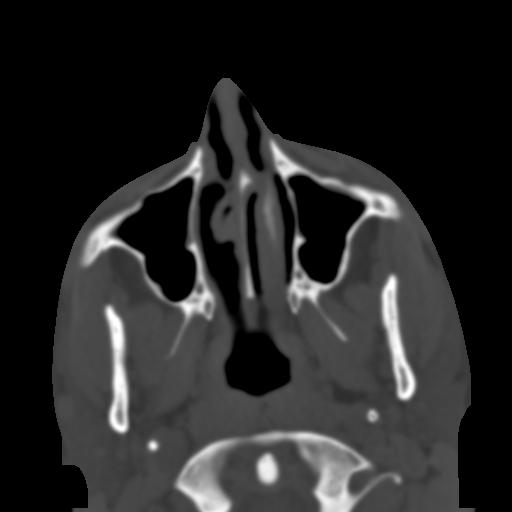
[im 56/85  bone]
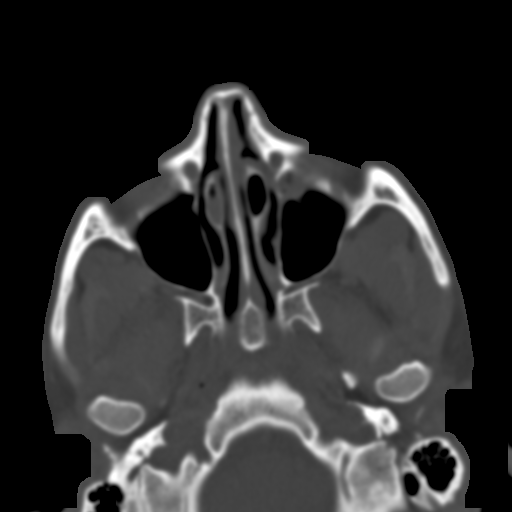
[im 64/85  bone]
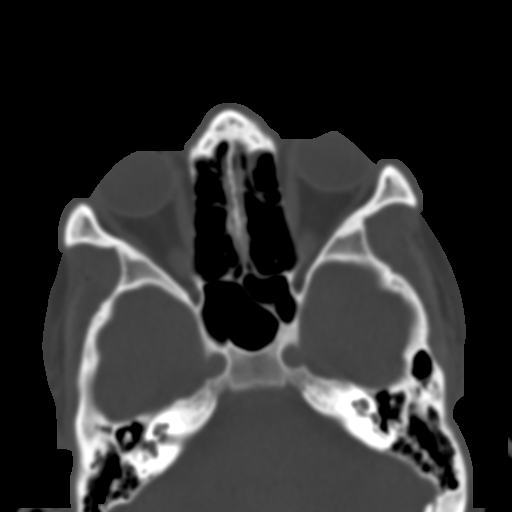
[im 70/85  brain]
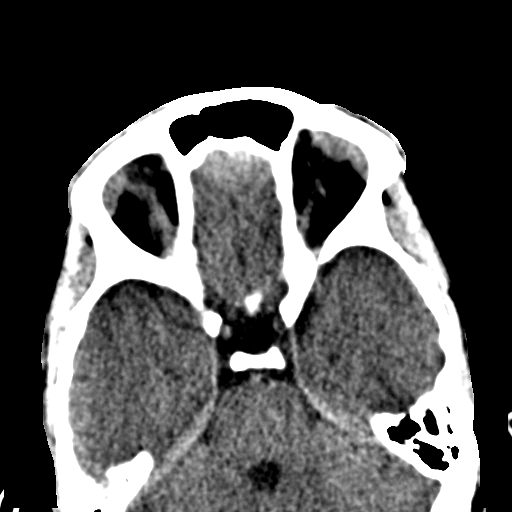
[im 70/85  bone]
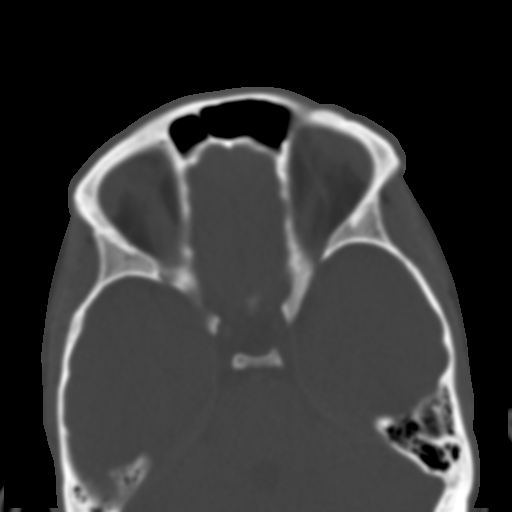
[im 79/85  bone]
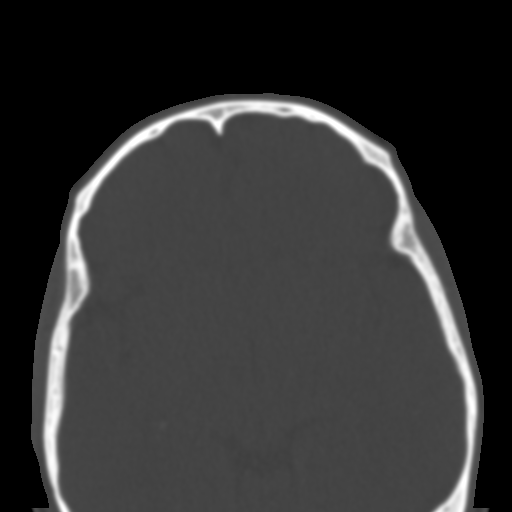

[Series 7: coronals · coronal · 0.31mm/px · 3 of 74 slices shown]
[im 25/74  bone]
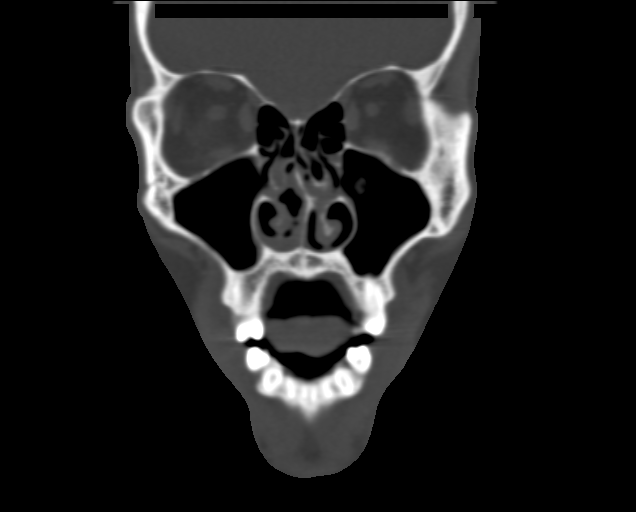
[im 33/74  bone]
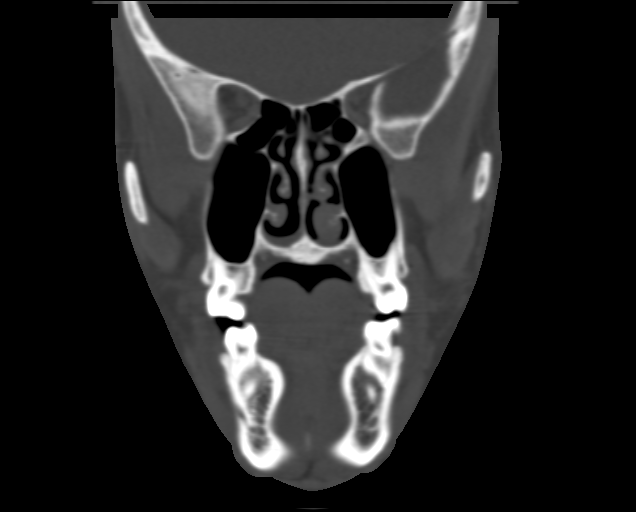
[im 41/74  bone]
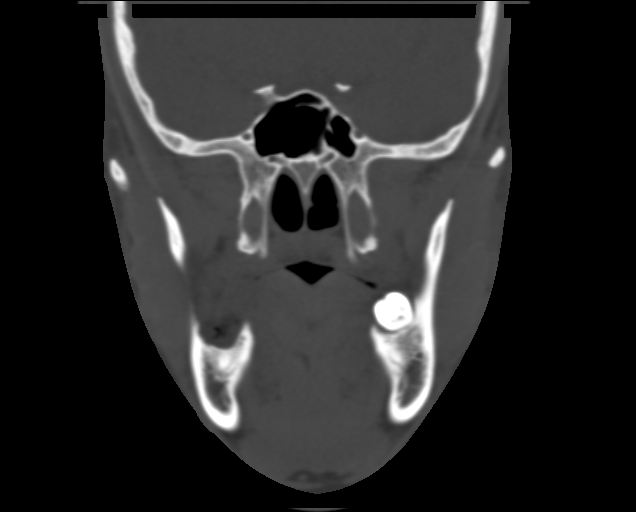

[Series 8: sagittals · sagittal · 0.31mm/px · 3 of 64 slices shown]
[im 22/64  bone]
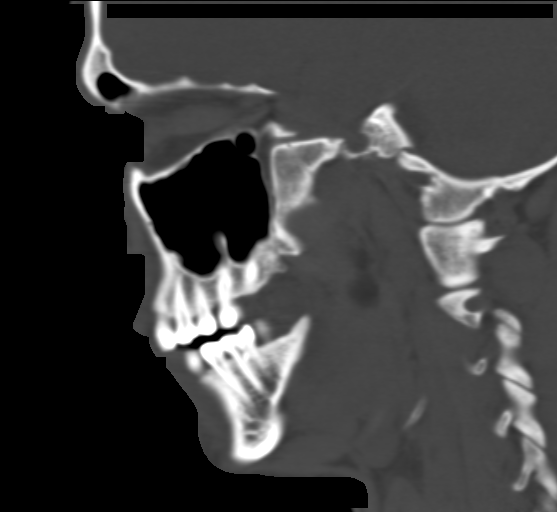
[im 32/64  bone]
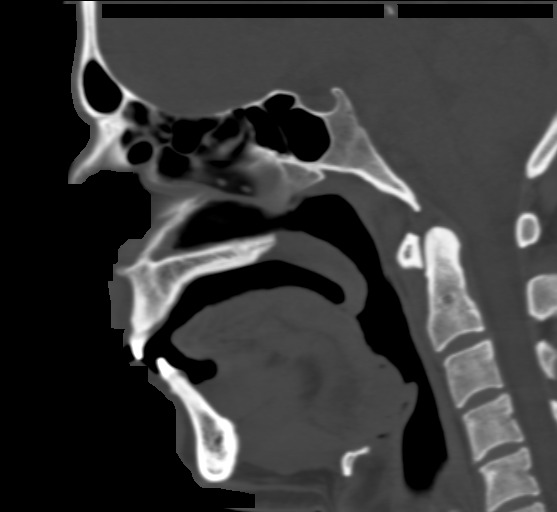
[im 43/64  bone]
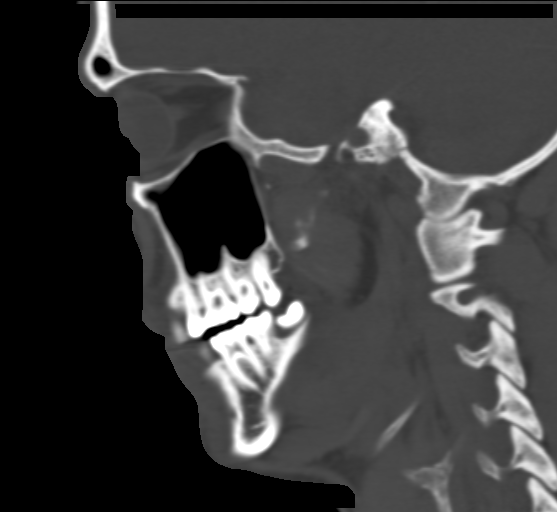

[16 of 47 positions shown; findings below may reference images not displayed]

FINDINGS: Facial bones appear intact.  Specifically, the mandible,
skull base, zygomas, pterygoid plates, maxilla, nasal septum, nasal
bones, and orbits all appear intact.  No facial bony trauma or
fracture.  Mastoids and sinuses are clear.  Right posterior
maxillary and mandibular osseous defects compatible with recent was
done to removal sites.  No facial asymmetry or definite soft tissue
fluid collection in these regions.  Visualized intracranial
contents unremarkable.  Symmetric orbits.
IMPRESSION: Postop changes right posterior mandible and maxilla from recent
wisdom tooth removal.  Soft tissue attenuation in the osseous
defects appears nonspecific by noncontrast CT.

No facial bony trauma or fracture.

## 2015-05-31 ENCOUNTER — Emergency Department
Admission: EM | Admit: 2015-05-31 | Discharge: 2015-05-31 | Discharge status: Against Medical Advice | Payer: Self-pay | Attending: Emergency Medicine | Admitting: Emergency Medicine

## 2015-05-31 ENCOUNTER — Encounter: Payer: Self-pay | Admitting: Emergency Medicine

## 2015-05-31 DIAGNOSIS — Z720 Tobacco use: Secondary | ICD-10-CM | POA: Insufficient documentation

## 2015-05-31 DIAGNOSIS — L299 Pruritus, unspecified: Secondary | ICD-10-CM | POA: Insufficient documentation

## 2015-05-31 DIAGNOSIS — J029 Acute pharyngitis, unspecified: Secondary | ICD-10-CM | POA: Insufficient documentation

## 2015-05-31 LAB — POCT RAPID STREP A: STREPTOCOCCUS, GROUP A SCREEN (DIRECT): NEGATIVE

## 2015-05-31 NOTE — ED Notes (Signed)
Pt presents to ER alert and in NAD. Pt states sore throat for several days. Pt also states itching all over.

## 2015-06-03 LAB — CULTURE, GROUP A STREP (THRC)
